# Patient Record
Sex: Female | Born: 1970 | Race: White | Hispanic: No | Marital: Married | Smoking: Never smoker
Health system: Southern US, Community
[De-identification: ages and names within clinical notes are randomized; demographics above are authoritative.]

## PROBLEM LIST (undated history)

## (undated) DIAGNOSIS — N870 Mild cervical dysplasia: Secondary | ICD-10-CM

## (undated) DIAGNOSIS — N943 Premenstrual tension syndrome: Secondary | ICD-10-CM

## (undated) DIAGNOSIS — R896 Abnormal cytological findings in specimens from other organs, systems and tissues: Secondary | ICD-10-CM

## (undated) DIAGNOSIS — IMO0001 Reserved for inherently not codable concepts without codable children: Secondary | ICD-10-CM

## (undated) HISTORY — DX: Premenstrual tension syndrome: N94.3

## (undated) HISTORY — PX: TUBAL LIGATION: SHX77

## (undated) HISTORY — DX: Abnormal cytological findings in specimens from other organs, systems and tissues: R89.6

## (undated) HISTORY — DX: Mild cervical dysplasia: N87.0

## (undated) HISTORY — DX: Reserved for inherently not codable concepts without codable children: IMO0001

---

## 2006-03-09 ENCOUNTER — Other Ambulatory Visit: Admission: RE | Admit: 2006-03-09 | Discharge: 2006-03-09 | Payer: Self-pay | Admitting: Obstetrics and Gynecology

## 2010-01-20 ENCOUNTER — Encounter: Admission: RE | Admit: 2010-01-20 | Discharge: 2010-01-20 | Payer: Self-pay | Admitting: Obstetrics and Gynecology

## 2010-01-27 ENCOUNTER — Encounter: Admission: RE | Admit: 2010-01-27 | Discharge: 2010-01-27 | Payer: Self-pay | Admitting: Obstetrics and Gynecology

## 2010-02-24 DIAGNOSIS — N870 Mild cervical dysplasia: Secondary | ICD-10-CM

## 2010-02-24 HISTORY — PX: COLPOSCOPY: SHX161

## 2010-02-24 HISTORY — DX: Mild cervical dysplasia: N87.0

## 2010-07-07 ENCOUNTER — Other Ambulatory Visit: Payer: Self-pay | Admitting: Obstetrics and Gynecology

## 2010-07-07 DIAGNOSIS — Z09 Encounter for follow-up examination after completed treatment for conditions other than malignant neoplasm: Secondary | ICD-10-CM

## 2010-07-30 ENCOUNTER — Ambulatory Visit
Admission: RE | Admit: 2010-07-30 | Discharge: 2010-07-30 | Disposition: A | Discharge status: Home / Self Care | Payer: BC Managed Care – PPO | Source: Ambulatory Visit | Attending: Obstetrics and Gynecology | Admitting: Obstetrics and Gynecology

## 2010-07-30 DIAGNOSIS — Z09 Encounter for follow-up examination after completed treatment for conditions other than malignant neoplasm: Secondary | ICD-10-CM

## 2011-09-03 DIAGNOSIS — N870 Mild cervical dysplasia: Secondary | ICD-10-CM | POA: Insufficient documentation

## 2011-09-03 DIAGNOSIS — N943 Premenstrual tension syndrome: Secondary | ICD-10-CM | POA: Insufficient documentation

## 2011-09-09 ENCOUNTER — Encounter: Payer: Self-pay | Admitting: Obstetrics and Gynecology

## 2011-09-17 ENCOUNTER — Ambulatory Visit (INDEPENDENT_AMBULATORY_CARE_PROVIDER_SITE_OTHER): Payer: BC Managed Care – PPO | Admitting: Obstetrics and Gynecology

## 2011-09-17 ENCOUNTER — Encounter: Payer: Self-pay | Admitting: Obstetrics and Gynecology

## 2011-09-17 VITALS — BP 112/72 | HR 76 | Resp 16 | Ht 62.0 in | Wt 186.0 lb

## 2011-09-17 DIAGNOSIS — N870 Mild cervical dysplasia: Secondary | ICD-10-CM

## 2011-09-17 NOTE — Progress Notes (Signed)
History of previous cervical treatment:  no treatment  Last pap showed:  ASCUS with NEGATIVE high risk HPV  Date 05/12/2011  High Risk HPV present: no  Colposcopy results:   CIN 1 HPV -   Date:   03/10/2010  Gardasil received: no  Offered:  n/a Tobacco use:  No Using Folic Acid: no  REPEAT PAP VISIT  Subjective:    Marcia Terry is a 41 y.o. female who presents for a  repeat pap for CIN1   Objective:    External genitalia: normal Perianal skin: no external genital warts noted Vagina: normal without discharge Cervix: normal in appearance  Assessment and Plan:   CIN1  Pap # 2/3 performed Return to the office in 4 months for repeat Pap until 3 consecutive normal Pap Smears Continue Folic Acid 1 mg daily  Marcia Terry AMD 5/24/201311:47 AM

## 2011-09-27 ENCOUNTER — Telehealth: Payer: Self-pay

## 2011-09-27 LAB — HUMAN PAPILLOMAVIRUS, HIGH RISK: HPV DNA High Risk: NOT DETECTED

## 2011-09-27 NOTE — Telephone Encounter (Signed)
HPV pending as of 09-27-11  ld

## 2011-09-29 ENCOUNTER — Encounter: Payer: Self-pay | Admitting: Obstetrics and Gynecology

## 2012-01-19 ENCOUNTER — Ambulatory Visit (INDEPENDENT_AMBULATORY_CARE_PROVIDER_SITE_OTHER): Payer: Self-pay | Admitting: Obstetrics and Gynecology

## 2012-01-19 ENCOUNTER — Encounter: Payer: Self-pay | Admitting: Obstetrics and Gynecology

## 2012-01-19 VITALS — BP 106/70 | Wt 155.0 lb

## 2012-01-19 DIAGNOSIS — N870 Mild cervical dysplasia: Secondary | ICD-10-CM

## 2012-01-19 DIAGNOSIS — R87610 Atypical squamous cells of undetermined significance on cytologic smear of cervix (ASC-US): Secondary | ICD-10-CM

## 2012-01-19 NOTE — Progress Notes (Signed)
History of previous cervical treatment:  no treatment  Last pap showed:  atypical squamous cellularity of undetermined significance (ASCUS)  Date 09/17/2011  High Risk HPV present: no  Colposcopy results:   CIN 1 LSIL, MILD DYSPLASIA AND HPV INFECTION  Date:   03/10/2010  Gardasil received: no  Offered:  no Tobacco use:  No Using Folic Acid: yes  REPEAT PAP VISIT  Subjective:    Marcia Terry is a 41 y.o. female who presents for a  repeat pap    Objective:    External genitalia: normal Perianal skin: no external genital warts noted Vagina: normal without discharge Cervix: normal in appearance  Assessment and Plan:   Abnormal pap or biopsy demonstrating CIN 1  Pap # 2/3 performed Return to the office in 4 months for AEX Continue Folic Acid 1 mg daily  Silverio Lay MD 9/25/201311:00 AM

## 2012-01-20 LAB — PAP IG W/ RFLX HPV ASCU

## 2012-01-21 ENCOUNTER — Telehealth: Payer: Self-pay

## 2012-01-21 NOTE — Telephone Encounter (Signed)
Message copied by Larwance Rote on Fri Jan 21, 2012  3:06 PM ------      Message from: Silverio Lay      Created: Thu Jan 20, 2012  8:08 PM       Please call with Pap results. Keep appointment for AEX in 4 months

## 2012-01-21 NOTE — Telephone Encounter (Signed)
LM for pt to keep appt for AEX in 4 months.  To call with questions.  ld

## 2012-04-14 ENCOUNTER — Telehealth: Payer: Self-pay | Admitting: Obstetrics and Gynecology

## 2012-04-14 NOTE — Telephone Encounter (Signed)
Lm on vm for pt to call back.

## 2012-04-18 MED ORDER — FOLIC ACID 0.5 MG HALF TAB: 1.0000 mg | ORAL_TABLET | Freq: Every day | ORAL | Status: DC

## 2012-04-18 NOTE — Telephone Encounter (Signed)
LEFT VM MESSAGE TO LET PT KNOW THAT WE SENT RX FOR FOLIC ACID TO PT PHARMACY.

## 2012-05-10 ENCOUNTER — Encounter: Payer: Self-pay | Admitting: Obstetrics and Gynecology

## 2012-05-10 ENCOUNTER — Ambulatory Visit: Payer: BC Managed Care – PPO | Admitting: Obstetrics and Gynecology

## 2012-05-10 VITALS — BP 100/70 | Ht 62.75 in | Wt 139.0 lb

## 2012-05-10 DIAGNOSIS — N870 Mild cervical dysplasia: Secondary | ICD-10-CM

## 2012-05-10 DIAGNOSIS — Z01419 Encounter for gynecological examination (general) (routine) without abnormal findings: Secondary | ICD-10-CM

## 2012-05-10 DIAGNOSIS — Z124 Encounter for screening for malignant neoplasm of cervix: Secondary | ICD-10-CM

## 2012-05-10 MED ORDER — DROSPIRENONE-ETHINYL ESTRADIOL 3-0.03 MG PO TABS: 1.0000 | ORAL_TABLET | Freq: Every day | ORAL | Status: DC

## 2012-05-10 NOTE — Progress Notes (Signed)
The patient has no complaints    Contraception:Ocella for management of PMS and BTL  Last mammogram: 07/30/2010 Normal Last pap: 05/12/2011 Atypical squamous cells  GC/Chlamydia cultures offered: declined HIV/RPR/HbsAg offered:  declined HSV 1 and 2 glycoprotein offered: declined  Menstrual cycle regular and monthly: Yes every 28 days Menstrual flow normal: Yes lasts 2 days   Urinary symptoms: none Normal bowel movements: Yes Reports abuse at home: No:   Subjective:    Marcia Terry is a 42 y.o. female, G2P2, who presents for an annual exam.     History   Social History  . Marital Status: Unknown    Spouse Name: N/A    Number of Children: N/A  . Years of Education: N/A   Social History Main Topics  . Smoking status: Never Smoker   . Smokeless tobacco: Never Used  . Alcohol Use: No  . Drug Use: No  . Sexually Active: Yes    Birth Control/ Protection: Pill     Comment: Ocella   Other Topics Concern  . None   Social History Narrative  . None    Menstrual cycle:   LMP: No LMP recorded.           Cycle: Normal  The following portions of the patient's history were reviewed and updated as appropriate: allergies, current medications, past family history, past medical history, past social history, past surgical history and problem list.  Review of Systems Pertinent items are noted in HPI. Breast:Negative for breast lump,nipple discharge or nipple retraction Gastrointestinal: Negative for abdominal pain, change in bowel habits or rectal bleeding Urinary:negative   Objective:    BP 100/70  Ht 5' 2.75" (1.594 m)  Wt 139 lb (63.05 kg)  BMI 24.82 kg/m2    Weight:  Wt Readings from Last 1 Encounters:  05/10/12 139 lb (63.05 kg)          BMI: Body mass index is 24.82 kg/(m^2).  General Appearance: Alert, appropriate appearance for age. No acute distress HEENT: Grossly normal Neck / Thyroid: Supple, no masses, nodes or enlargement Lungs: clear to auscultation  bilaterally Back: No CVA tenderness Breast Exam: No dimpling, nipple retraction or discharge. No masses or nodes. and No masses or nodes.No dimpling, nipple retraction or discharge. Cardiovascular: Regular rate and rhythm. S1, S2, no murmur Gastrointestinal: Soft, non-tender, no masses or organomegaly Pelvic Exam: Vulva and vagina appear normal. Bimanual exam reveals normal uterus and adnexa. Rectovaginal: normal rectal, no masses Lymphatic Exam: Non-palpable nodes in neck, clavicular, axillary, or inguinal regions Skin: no rash or abnormalities Neurologic: Normal gait and speech, no tremor  Psychiatric: Alert and oriented, appropriate affect.   Assessment:    Normal gyn exam  Lost 53 lbs in the last year with healthy diet and exercise!   Plan:    mammogram pap smear return annually or prn STD screening: declined Contraception:bilateral tubal ligation MMG with BC Urinary Activity Discussed   Silverio Lay MD

## 2012-05-11 LAB — PAP IG W/ RFLX HPV ASCU

## 2012-05-29 ENCOUNTER — Telehealth: Payer: Self-pay | Admitting: Obstetrics and Gynecology

## 2012-05-29 MED ORDER — DROSPIRENONE-ETHINYL ESTRADIOL 3-0.03 MG PO TABS: 1.0000 | ORAL_TABLET | Freq: Every day | ORAL | Status: DC

## 2012-05-29 NOTE — Telephone Encounter (Signed)
Pt states that Marcia Terry is the [facility] that she has been receiving. States that it was ordered wrong with the pharmacy.  New Rx called into pharmacy.  Darien Ramus, CMA

## 2012-06-13 ENCOUNTER — Other Ambulatory Visit: Payer: Self-pay | Admitting: Obstetrics and Gynecology

## 2012-06-13 DIAGNOSIS — Z1231 Encounter for screening mammogram for malignant neoplasm of breast: Secondary | ICD-10-CM

## 2012-07-05 ENCOUNTER — Ambulatory Visit
Admission: RE | Admit: 2012-07-05 | Discharge: 2012-07-05 | Disposition: A | Discharge status: Home / Self Care | Payer: BC Managed Care – PPO | Source: Ambulatory Visit | Attending: Obstetrics and Gynecology | Admitting: Obstetrics and Gynecology

## 2012-07-05 DIAGNOSIS — Z1231 Encounter for screening mammogram for malignant neoplasm of breast: Secondary | ICD-10-CM

## 2012-07-05 IMAGING — MG MM DIGITAL SCREENING BILAT
4 series · 4 of 4 positions shown · non-contrast
Comparison: Previous exams.

CLINICAL DATA: Screening.

DIGITAL BILATERAL SCREENING MAMMOGRAM WITH CAD

[R CC]
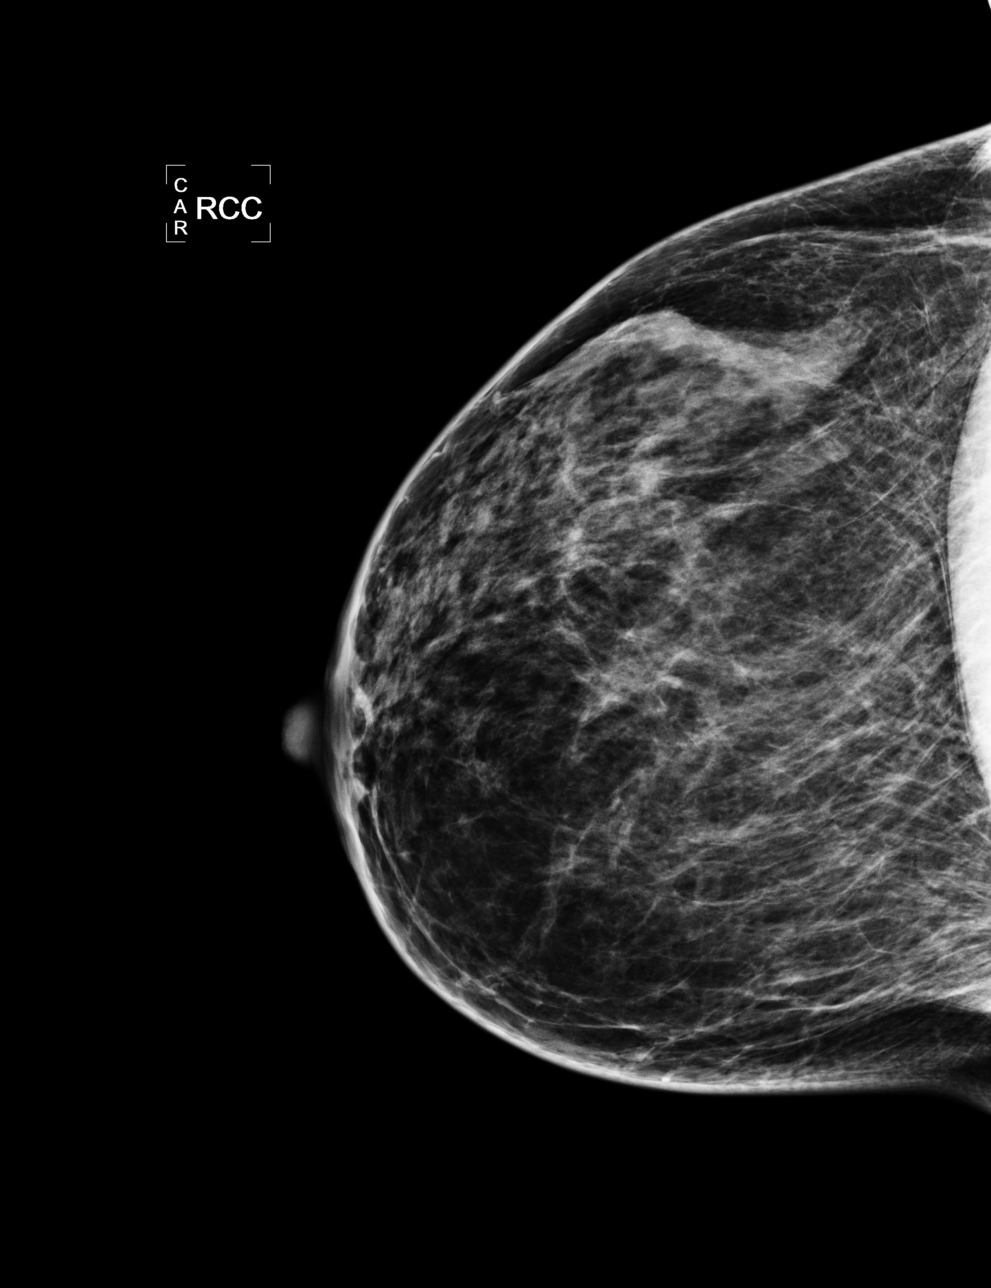

[L CC]
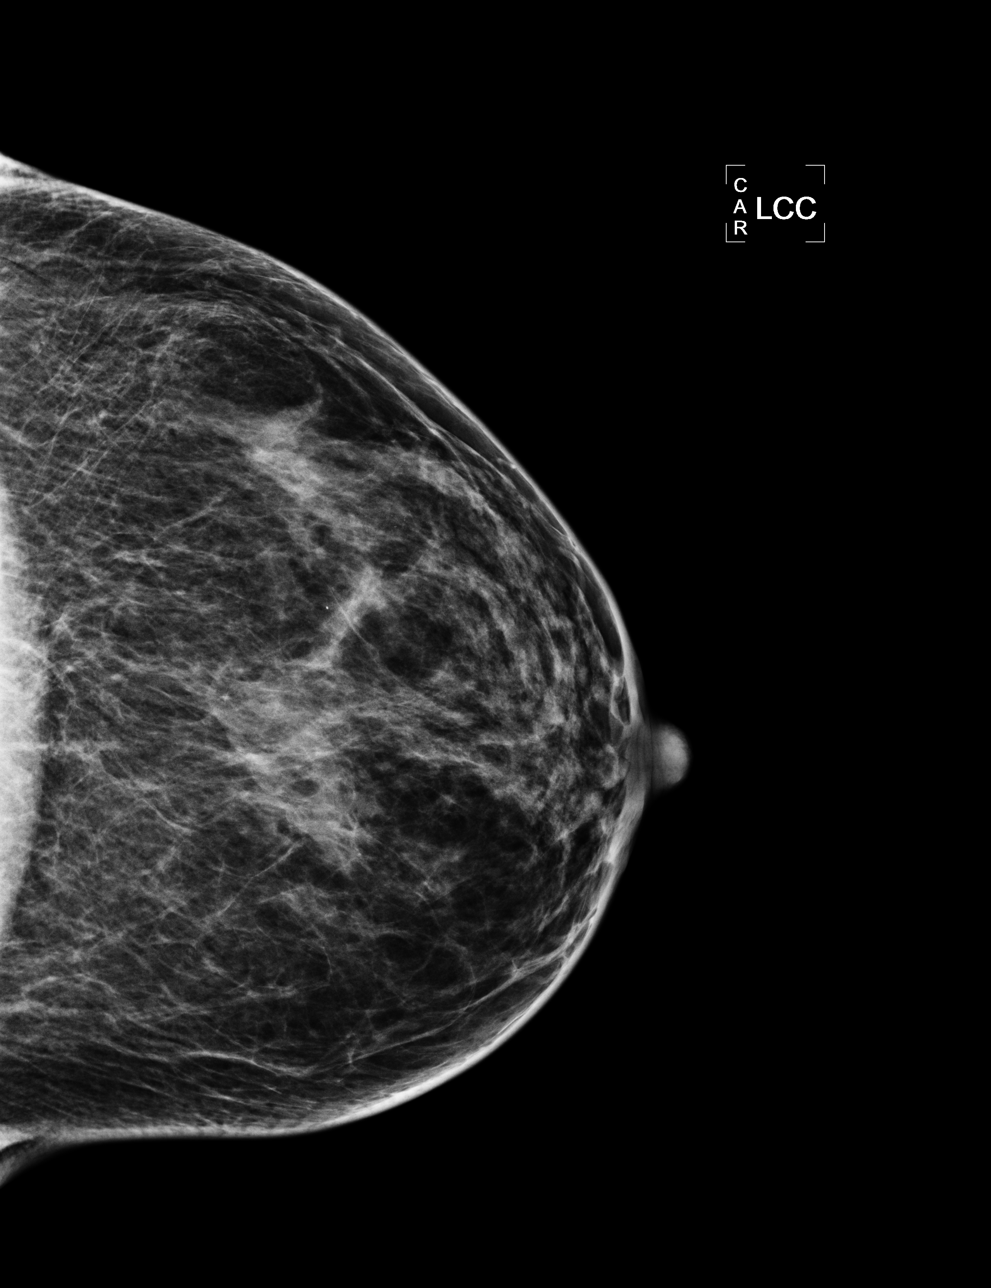

[L MLO]
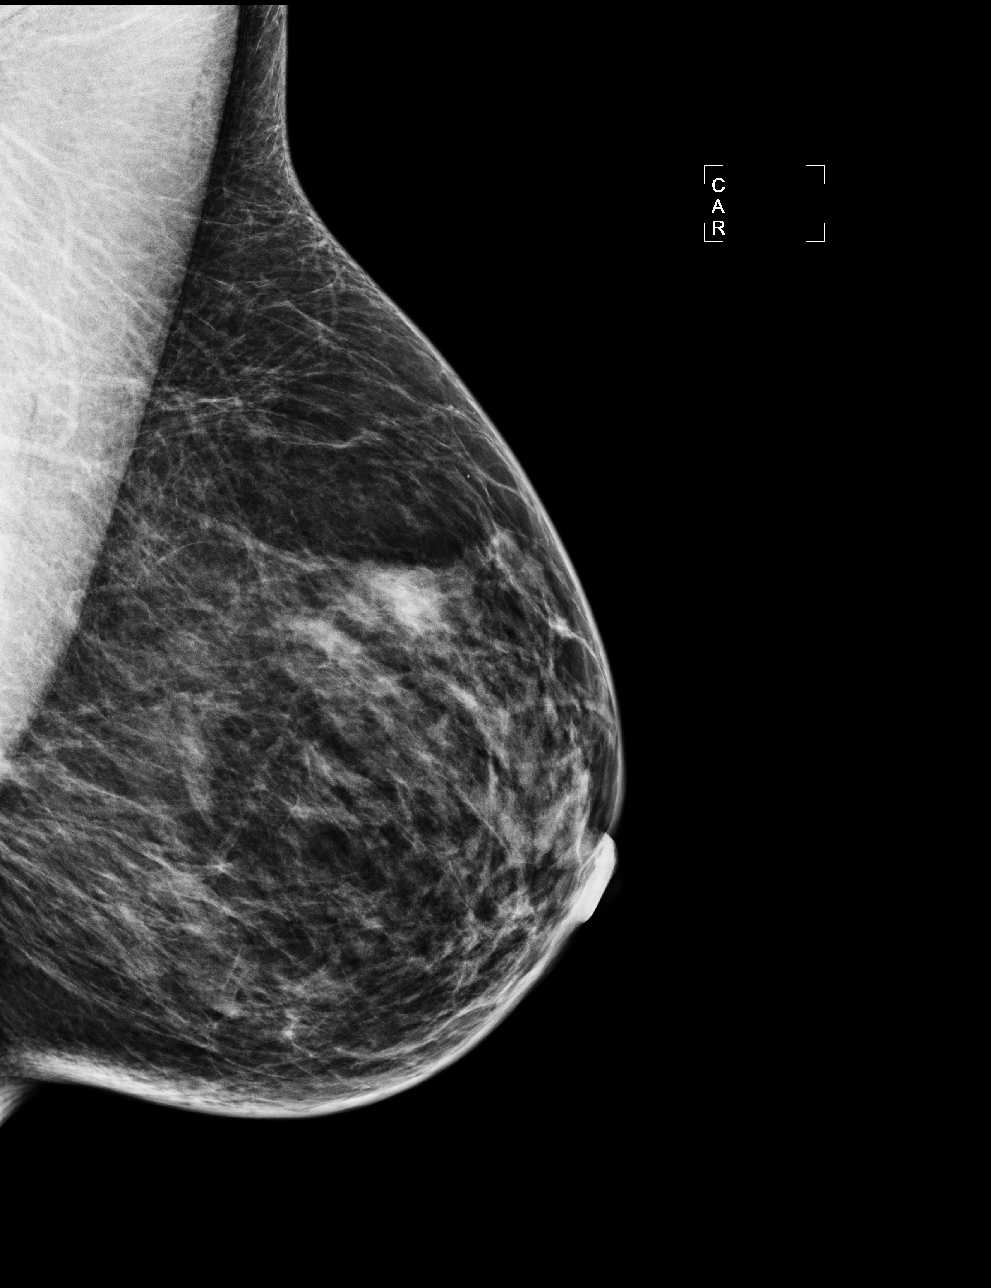

[R MLO]
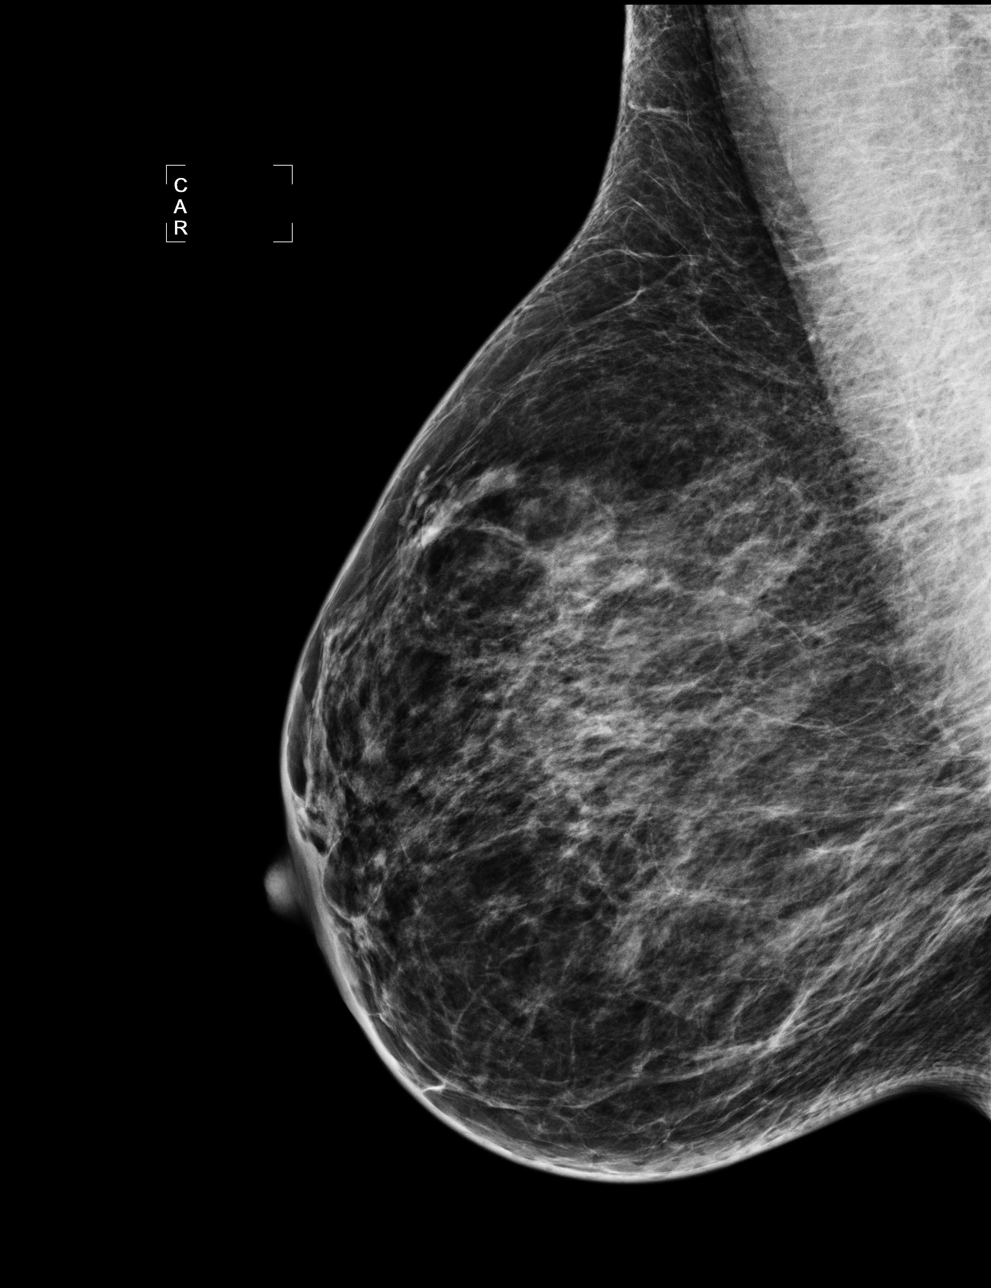

[4 of 4 positions shown; findings below may reference images not displayed]

FINDINGS: ACR Breast Density Category 2: There is a scattered fibroglandular
pattern.

No suspicious masses, architectural distortion, or calcifications
are present.

Images were processed with CAD.
IMPRESSION: No mammographic evidence of malignancy.

A result letter of this screening mammogram will be mailed directly
to the patient.

RECOMMENDATION:
Screening mammogram in one year. (Code:[FI])

BI-RADS CATEGORY 1:  Negative.

## 2014-02-25 ENCOUNTER — Encounter: Payer: Self-pay | Admitting: Obstetrics and Gynecology

## 2014-05-23 ENCOUNTER — Other Ambulatory Visit: Payer: Self-pay | Admitting: Obstetrics and Gynecology

## 2014-05-23 DIAGNOSIS — Z1231 Encounter for screening mammogram for malignant neoplasm of breast: Secondary | ICD-10-CM

## 2014-05-30 ENCOUNTER — Ambulatory Visit
Admission: RE | Admit: 2014-05-30 | Discharge: 2014-05-30 | Disposition: A | Discharge status: Home / Self Care | Payer: BLUE CROSS/BLUE SHIELD | Source: Ambulatory Visit | Attending: Obstetrics and Gynecology | Admitting: Obstetrics and Gynecology

## 2014-05-30 DIAGNOSIS — Z1231 Encounter for screening mammogram for malignant neoplasm of breast: Secondary | ICD-10-CM

## 2014-05-31 ENCOUNTER — Other Ambulatory Visit: Payer: Self-pay | Admitting: Obstetrics and Gynecology

## 2014-05-31 DIAGNOSIS — R928 Other abnormal and inconclusive findings on diagnostic imaging of breast: Secondary | ICD-10-CM

## 2014-06-12 ENCOUNTER — Ambulatory Visit
Admission: RE | Admit: 2014-06-12 | Discharge: 2014-06-12 | Disposition: A | Discharge status: Home / Self Care | Payer: BLUE CROSS/BLUE SHIELD | Source: Ambulatory Visit | Attending: Obstetrics and Gynecology | Admitting: Obstetrics and Gynecology

## 2014-06-12 ENCOUNTER — Other Ambulatory Visit: Payer: Self-pay | Admitting: Obstetrics and Gynecology

## 2014-06-12 DIAGNOSIS — R928 Other abnormal and inconclusive findings on diagnostic imaging of breast: Secondary | ICD-10-CM

## 2014-06-21 ENCOUNTER — Telehealth: Payer: Self-pay | Admitting: Genetic Counselor

## 2014-06-21 NOTE — Telephone Encounter (Signed)
Left mess regarding appt on 07/11/14 at 11 w/Genetic Dx Genetic testing Referred [facility]

## 2014-06-24 ENCOUNTER — Telehealth: Payer: Self-pay | Admitting: Genetic Counselor

## 2014-06-24 NOTE — Telephone Encounter (Signed)
Lt mess regarding appt on 07/11/14 at 11 for Genetic testing

## 2014-06-26 ENCOUNTER — Telehealth: Payer: Self-pay | Admitting: Genetic Counselor

## 2014-06-26 NOTE — Telephone Encounter (Signed)
Pt never confirmed appt, called and informed pt that appt has been cancelled, referring office was contacted in regards to the cancel referral

## 2014-07-11 ENCOUNTER — Other Ambulatory Visit: Payer: BLUE CROSS/BLUE SHIELD

## 2014-07-11 ENCOUNTER — Ambulatory Visit: Payer: BLUE CROSS/BLUE SHIELD

## 2017-05-12 NOTE — Progress Notes (Signed)
Psychiatric Initial Adult Assessment   Patient Identification: Marcia Terry MRN:  [id number] Date of Evaluation:  05/16/2017 Referral Source: Dr. Kirstie PeriAshish Shah Chief Complaint:   Chief Complaint    Depression; Psychiatric Evaluation    "It's hormonal" Visit Diagnosis:    ICD-10-CM   1. MDD (major depressive disorder), recurrent episode, moderate (HCC) F33.1     History of Present Illness:   Marcia Terry is a 47 y.o. year old female with a history of depression, who is referred for depression.  Per chart from Dr. Beatrix FettersAshish, patient is on sertraline.    Patient started up crying; stating that it is "hormonal." She repeatedly states that she is "not like this (crying)" and "in control." She states that her grandmother and her paternal and deceased from Huntington disease.  She states that her father, 49 year old was recently diagnosed with Huntington disease.  She states that it (Huntington's disease) has been always back of her mind and her family would think "who will start to have symptoms." Although she is hoping to get genetic test, she wants to see somebody first (mental health specialist) so that she can handle things better. She notices that she "stopped caring about everything" over the past several months. She reports as an example that her room does not appear as she has a degree in Social research officer, governmentinterior design. She also reports "net empty house"; talks about her son, who got divorce and her daughter, whose husband is laid off from work. 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 She had spoken with her husband about Huntington's disease; she reports that he takes it well and provides her good support. She takes sertraline 25 mg daily for six weeks; although she feels "better" since then, it makes her some fogginess.   Patient endorses middle insomnia, stating that she takes care of her 49-year-old grand son.  She feels fatigued and has anhedonia. She has fair appetite.   She has difficulty with concentration.  She denies SI.  She feels anxious and tense and has occasional panic attacks.  She occasionally drinks alcohol.  She denies drug use.   Associated Signs/Symptoms: Depression Symptoms:  depressed mood, anhedonia, insomnia, fatigue, (Hypo) Manic Symptoms:  denies decreased for sleep or euphoria Anxiety Symptoms:  Excessive Worry, Panic Symptoms, Psychotic Symptoms:  denies paranoia,AH, VH PTSD Symptoms: NA  Past Psychiatric History:  Outpatient: denies (diagnosed with depression when she had a son) Psychiatry admission: denies  Previous suicide attempt: denies  Past trials of medication: sertraline, fluoxetine ("could not function") History of violence:   Previous Psychotropic Medications: Yes   Substance Abuse History in the last 12 months:  No.  Consequences of Substance Abuse: NA  Past Medical History:  Past Medical History:  Diagnosis Date  . ASCUS (atypical squamous cells of undetermined significance) on Pap smear   . CIN I (cervical intraepithelial neoplasia I) 11/11   colpo done  . PMS (premenstrual syndrome)     Past Surgical History:  Procedure Laterality Date  . COLPOSCOPY  11/11  . TUBAL LIGATION      Family Psychiatric History:  Denies    Family History:  Family History  Problem Relation Age of Onset  . Cancer Maternal Grandmother 80       colon  . Hypertension Mother   . Huntington's disease Paternal Aunt   . Huntington's disease Paternal Grandmother     Social History:   Social History   Socioeconomic History  . Marital status: Married  Spouse name: None  . Number of children: None  . Years of education: None  . Highest education level: None  Social Needs  . Financial resource strain: None  . Food insecurity - worry: None  . Food insecurity - inability: None  . Transportation needs - medical: None  . Transportation needs - non-medical: None  Occupational History  . None  Tobacco Use  .  Smoking status: Never Smoker  . Smokeless tobacco: Never Used  Substance and Sexual Activity  . Alcohol use: No  . Drug use: No  . Sexual activity: Yes    Birth control/protection: Pill    Comment: Ocella  Other Topics Concern  . None  Social History Narrative  . None    Additional Social History:  Married for 29 years, has two children (23, 24), one in [employer] Education: graduated from college Work: unemployed  Allergies:   Allergies  Allergen Reactions  . Penicillins     Metabolic Disorder Labs: No results found for: HGBA1C, MPG No results found for: PROLACTIN No results found for: CHOL, TRIG, HDL, CHOLHDL, VLDL, LDLCALC   Current Medications: Current Outpatient Medications  Medication Sig Dispense Refill  . Cholecalciferol (VITAMIN D PO) Take by mouth.    . drospirenone-ethinyl estradiol (YASMIN,ZARAH,SYEDA) 3-0.03 MG tablet Take 1 tablet by mouth daily. 1 Package 11  . fish oil-omega-3 fatty acids 1000 MG capsule Take 2 g by mouth daily.    . folic acid (FOLVITE) 0.5 MG tablet Take 1 tablet (1 mg total) by mouth daily. 30 tablet 11  . venlafaxine XR (EFFEXOR-XR) 37.5 MG 24 hr capsule Start 37.5 mg daily for one week, then 75 mg daily 60 capsule 0   No current facility-administered medications for this visit.     Neurologic: Headache: No Seizure: No Paresthesias:No  Musculoskeletal: Strength & Muscle Tone: within normal limits Gait & Station: normal Patient leans: N/A  Psychiatric Specialty Exam: Review of Systems  Psychiatric/Behavioral: Positive for depression. Negative for hallucinations, memory loss, substance abuse and suicidal ideas. The patient is nervous/anxious and has insomnia.   All other systems reviewed and are negative.   Blood pressure 112/60, pulse 65, height 5\' 2"  (1.575 m), weight 137 lb (62.1 kg), SpO2 100 %.Body mass index is 25.06 kg/m.  General Appearance: Fairly Groomed  Eye Contact:  Good  Speech:  Clear and Coherent, repeatedly  interrupts interview , although redirectable  Volume:  Normal  Mood:  Anxious and Depressed  Affect:  Appropriate, Congruent, Restricted and Tearful  Thought Process:  Coherent and Goal Directed  Orientation:  Full (Time, Place, and Person)  Thought Content:  Logical  Suicidal Thoughts:  No  Homicidal Thoughts:  No  Memory:  Immediate;   Good Recent;   Good Remote;   Good  Judgement:  Good  Insight:  Fair  Psychomotor Activity:  Normal  Concentration:  Concentration: Good and Attention Span: Good  Recall:  Good  Fund of Knowledge:Good  Language: Good  Akathisia:  No  Handed:  Right  AIMS (if indicated):  N/A  Assets:  Communication Skills Desire for Improvement  ADL's:  Intact  Cognition: WNL  Sleep:  poor   Assessment Marcia Terry is a 47 y.o. year old female with a history of depression, who is referred for depression.  Per chart from Dr. Beatrix Fetters, patient is on sertraline.    # MDD, moderate, recurrent without psychotic features Exam is notable for tearfulness and the patient endorses neurovegetative symptoms and anxiety in the setting of  her father being diagnosed with Huntington disease and concerned about her children.  We will switch from sertraline to venlafaxine to target depression and anxiety given patient complains of some fogginess from sertraline.  Will start from lower dose given patient reports sensitivity to medication.  Validated her concerns about Huntington disease.  Discussed self compassion and cognitive diffusion.  She will greatly benefit from supportive therapy/CBT; referral is made.   Plan 1. Discontinue sertraline  2. Stat venlafaxine 37.5 mg daily for one week, then 75 mg daily  3. Referral to therapy 4. Return to clinic in one month for 30 mins - pending TSH at Dr. Margaretmary Eddy office - Will ask more about her developmental history  The patient demonstrates the following risk factors for suicide: Chronic risk factors for suicide include: psychiatric  disorder of depression. Acute risk factors for suicide include: N/A. Protective factors for this patient include: positive social support, responsibility to others (children, family), coping skills and hope for the future. Considering these factors, the overall suicide risk at this point appears to be low. Patient is appropriate for outpatient follow up.   Treatment Plan Summary: Plan as above   [name], MD 1/21/20193:47 PM

## 2017-05-16 ENCOUNTER — Encounter (HOSPITAL_COMMUNITY): Payer: Self-pay | Admitting: Psychiatry

## 2017-05-16 ENCOUNTER — Encounter (INDEPENDENT_AMBULATORY_CARE_PROVIDER_SITE_OTHER): Payer: Self-pay

## 2017-05-16 ENCOUNTER — Ambulatory Visit (INDEPENDENT_AMBULATORY_CARE_PROVIDER_SITE_OTHER): Payer: BLUE CROSS/BLUE SHIELD | Admitting: Psychiatry

## 2017-05-16 VITALS — BP 112/60 | HR 65 | Ht 62.0 in | Wt 137.0 lb

## 2017-05-16 DIAGNOSIS — R450 Nervousness: Secondary | ICD-10-CM

## 2017-05-16 DIAGNOSIS — G4700 Insomnia, unspecified: Secondary | ICD-10-CM | POA: Diagnosis not present

## 2017-05-16 DIAGNOSIS — F419 Anxiety disorder, unspecified: Secondary | ICD-10-CM | POA: Diagnosis not present

## 2017-05-16 DIAGNOSIS — F410 Panic disorder [episodic paroxysmal anxiety] without agoraphobia: Secondary | ICD-10-CM | POA: Diagnosis not present

## 2017-05-16 DIAGNOSIS — F331 Major depressive disorder, recurrent, moderate: Secondary | ICD-10-CM | POA: Diagnosis not present

## 2017-05-16 MED ORDER — VENLAFAXINE HCL ER 37.5 MG PO CP24: ORAL_CAPSULE | ORAL | 0 refills | Status: DC

## 2017-05-16 NOTE — Patient Instructions (Addendum)
1. Discontinue sertraline  2. Stat venlafaxine 37.5 mg daily for one week, then 75 mg daily  3. Referral to therapy 4. Return to clinic in one month for 30 mins

## 2017-06-10 ENCOUNTER — Ambulatory Visit (HOSPITAL_COMMUNITY): Payer: Self-pay | Admitting: Psychiatry

## 2017-06-14 NOTE — Progress Notes (Signed)
BH MD/PA/NP OP Progress Note  06/16/2017 3:31 PM Marcia Terry  MRN:  [id number]  Chief Complaint:  Chief Complaint    Depression; Anxiety; Follow-up     HPI:  Patient presents for follow-up appointment for depression.  She states that she feels better after starting Effexor.  She talks with her mother and received update about her father; he seems to be responding well to medication.  Her mother commented that the patient seems to be doing better. he feels that it is true and feels "lifted." It has been getting easier for her to deal with things. She enjoys being with her 62-year-old grandchildren.  She notices that she has mild diaphoresis and more insomnia after starting Effexor; she prefers to stay on current dose as she finds it beneficial for her mood.  She has good appetite.  She has better concentration.  She has more energy and motivation.  She denies SI.  She occasionally feels anxious and tense.  She feels less irritable.  She denies panic attacks.   Visit Diagnosis:    ICD-10-CM   1. MDD (major depressive disorder), recurrent episode, moderate (HCC) F33.1     Past Psychiatric History:  I have reviewed the patient's psychiatry history in detail and updated the patient record. Outpatient: denies (diagnosed with depression when she had a son) Psychiatry admission: denies  Previous suicide attempt: denies  Past trials of medication: sertraline, fluoxetine ("could not function") History of violence:     Past Medical History:  Past Medical History:  Diagnosis Date  . ASCUS (atypical squamous cells of undetermined significance) on Pap smear   . CIN I (cervical intraepithelial neoplasia I) 11/11   colpo done  . PMS (premenstrual syndrome)     Past Surgical History:  Procedure Laterality Date  . COLPOSCOPY  11/11  . TUBAL LIGATION      Family Psychiatric History: I have reviewed the patient's family history in detail and updated the patient record.  Family History:   Family History  Problem Relation Age of Onset  . Hypertension Mother   . Huntington's disease Father   . Cancer Maternal Grandmother 80       colon  . Huntington's disease Paternal Aunt   . Huntington's disease Paternal Grandmother     Social History:  Social History   Socioeconomic History  . Marital status: Married    Spouse name: None  . Number of children: None  . Years of education: None  . Highest education level: None  Social Needs  . Financial resource strain: None  . Food insecurity - worry: None  . Food insecurity - inability: None  . Transportation needs - medical: None  . Transportation needs - non-medical: None  Occupational History  . None  Tobacco Use  . Smoking status: Never Smoker  . Smokeless tobacco: Never Used  Substance and Sexual Activity  . Alcohol use: No  . Drug use: No  . Sexual activity: Yes    Birth control/protection: Pill    Comment: Ocella  Other Topics Concern  . None  Social History Narrative  . None   Married for 29 years, has two children (23, 34), one in [employer] Education: graduated from college Work: unemployed  Allergies:  Allergies  Allergen Reactions  . Penicillins     Metabolic Disorder Labs: No results found for: HGBA1C, MPG No results found for: PROLACTIN No results found for: CHOL, TRIG, HDL, CHOLHDL, VLDL, LDLCALC No results found for: TSH  Therapeutic Level Labs: No results  found for: LITHIUM No results found for: VALPROATE No components found for:  CBMZ  Current Medications: Current Outpatient Medications  Medication Sig Dispense Refill  . Cholecalciferol (VITAMIN D PO) Take by mouth.    . drospirenone-ethinyl estradiol (YASMIN,ZARAH,SYEDA) 3-0.03 MG tablet Take 1 tablet by mouth daily. 1 Package 11  . fish oil-omega-3 fatty acids 1000 MG capsule Take 2 g by mouth daily.    . folic acid (FOLVITE) 0.5 MG tablet Take 1 tablet (1 mg total) by mouth daily. 30 tablet 11  . venlafaxine XR (EFFEXOR-XR) 75 MG  24 hr capsule Take 1 capsule (75 mg total) by mouth daily. 30 capsule 0  . traZODone (DESYREL) 50 MG tablet 25-50 mg at night as needed for sleep 30 tablet 0   No current facility-administered medications for this visit.      Musculoskeletal: Strength & Muscle Tone: within normal limits Gait & Station: normal Patient leans: N/A  Psychiatric Specialty Exam: Review of Systems  Psychiatric/Behavioral: Positive for depression. Negative for hallucinations, substance abuse and suicidal ideas. The patient is nervous/anxious and has insomnia.   All other systems reviewed and are negative.   Blood pressure 134/90, pulse 90, height 5\' 2"  (1.575 m), weight 136 lb (61.7 kg), SpO2 100 %.Body mass index is 24.87 kg/m.  General Appearance: Fairly Groomed  Eye Contact:  Good  Speech:  Clear and Coherent  Volume:  Normal  Mood:  "better"  Affect:  Appropriate, Congruent and reactive  Thought Process:  Coherent and Goal Directed  Orientation:  Full (Time, Place, and Person)  Thought Content: Logical   Suicidal Thoughts:  No  Homicidal Thoughts:  No  Memory:  Immediate;   Good Recent;   Good Remote;   Good  Judgement:  Good  Insight:  Fair  Psychomotor Activity:  Normal  Concentration:  Concentration: Good and Attention Span: Good  Recall:  Good  Fund of Knowledge: Good  Language: Good  Akathisia:  No  Handed:  Right  AIMS (if indicated): not done  Assets:  Communication Skills Desire for Improvement  ADL's:  Intact  Cognition: WNL  Sleep:  Poor   Screenings:   Assessment and Plan:  Marcia Terry is a 47 y.o. year old female with a history of depression, who presents for follow up appointment for MDD (major depressive disorder), recurrent episode, moderate (HCC)  # MDD, moderate, recurrent without psychotic features There has been significant improvement in neurovegetative symptoms and anxiety since starting Effexor.  Psychosocial stressors including her father being diagnosed  with Huntington disease.  Will continue Effexor to target depression and anxiety.  Noted that patient reports worsening insomnia since starting on this medication, although she prefers to stay on Effexor.  Will continue to monitor. Discussed cognitive diffusion.  She will greatly benefit from supportive therapy/CBT; she has an appointment with Ms. Bynum for therapy.   # Insomnia  Discussed sleep hygiene.  Discussed behavioral activation. She may try melatonin for insomia.  Start trazodone as needed for insomnia.     Plan 1.Continue Venlafaxine 75 mg daily  2. Start Trazodone 25 mg-50 mg at night as needed for sleep 3. Return to clinic in one month for 30 mins 4. She is scheduled to see Ms. Bynum for therapy - pending TSH at Dr. Margaretmary EddyShah's office - Will ask more about her developmental history  The patient demonstrates the following risk factors for suicide: Chronic risk factors for suicide include: psychiatric disorder of depression. Acute risk factors for suicide include: N/A. Protective  factors for this patient include: positive social support, responsibility to others (children, family), coping skills and hope for the future. Considering these factors, the overall suicide risk at this point appears to be low. Patient is appropriate for outpatient follow up.  The duration of this appointment visit was 30 minutes of face-to-face time with the patient.  Greater than 50% of this time was spent in counseling, explanation of  diagnosis, planning of further management, and coordination of care.  [name], MD 06/16/2017, 3:31 PM

## 2017-06-16 ENCOUNTER — Encounter (HOSPITAL_COMMUNITY): Payer: Self-pay | Admitting: Psychiatry

## 2017-06-16 ENCOUNTER — Ambulatory Visit (INDEPENDENT_AMBULATORY_CARE_PROVIDER_SITE_OTHER): Payer: BLUE CROSS/BLUE SHIELD | Admitting: Psychiatry

## 2017-06-16 VITALS — BP 134/90 | HR 90 | Ht 62.0 in | Wt 136.0 lb

## 2017-06-16 DIAGNOSIS — G4700 Insomnia, unspecified: Secondary | ICD-10-CM

## 2017-06-16 DIAGNOSIS — F331 Major depressive disorder, recurrent, moderate: Secondary | ICD-10-CM | POA: Diagnosis not present

## 2017-06-16 DIAGNOSIS — R450 Nervousness: Secondary | ICD-10-CM | POA: Diagnosis not present

## 2017-06-16 DIAGNOSIS — F419 Anxiety disorder, unspecified: Secondary | ICD-10-CM

## 2017-06-16 DIAGNOSIS — R61 Generalized hyperhidrosis: Secondary | ICD-10-CM

## 2017-06-16 MED ORDER — TRAZODONE HCL 50 MG PO TABS: ORAL_TABLET | ORAL | 0 refills | Status: DC

## 2017-06-16 MED ORDER — VENLAFAXINE HCL ER 75 MG PO CP24: 75.0000 mg | ORAL_CAPSULE | Freq: Every day | ORAL | 0 refills | Status: DC

## 2017-06-16 NOTE — Patient Instructions (Addendum)
1.Continue Venlafaxine 75 mg daily  2. Try malatonin 3 mg at night two hours before going to bed 2. Start Trazodone 25 mg-50 mg at night as needed for sleep 3. Return to clinic in one month for 30 mins

## 2017-06-30 ENCOUNTER — Ambulatory Visit (HOSPITAL_COMMUNITY): Payer: Self-pay | Admitting: Psychiatry

## 2017-07-11 ENCOUNTER — Other Ambulatory Visit (HOSPITAL_COMMUNITY): Payer: Self-pay | Admitting: Psychiatry

## 2017-07-11 MED ORDER — VENLAFAXINE HCL ER 75 MG PO CP24: 75.0000 mg | ORAL_CAPSULE | Freq: Every day | ORAL | 0 refills | Status: DC

## 2017-07-11 MED ORDER — TRAZODONE HCL 50 MG PO TABS: ORAL_TABLET | ORAL | 0 refills | Status: DC

## 2017-07-19 NOTE — Progress Notes (Signed)
BH MD/PA/NP OP Progress Note  07/21/2017 3:01 PM Marcia Terry  MRN:  [id number]  Chief Complaint:  Chief Complaint    Anxiety; Depression; Follow-up     HPI:  Patient presents for follow-up appointment for depression.  She reports frustration of not having been able to see her therapist due to a couple of reschedule.  She would like to find a therapist in another office.  She states that she would like to talk things so that she will be "mentally ready" whether or not to take gene test for Huntington's disease. She wants to hope for the best and prepare for the worst. She states that there will be a new medication to stop progression of huntington's disease, and she feels hopeful about it. Although she feels occasionally down and anxious, she believes that it has become easier to manage it. She finds trazodone to be helpful for insomnia. She denies crying spells as she used to. She has fair concentration. She denies panic attacks. She denies    Visit Diagnosis:    ICD-10-CM   1. MDD (major depressive disorder), recurrent episode, moderate (HCC) F33.1     Past Psychiatric History:  I have reviewed the patient's psychiatry history in detail and updated the patient record. Outpatient:denies (diagnosed with depression when she had a son) Psychiatry admission:denies Previous suicide attempt:denies Past trials of medication:sertraline, fluoxetine ("could not function") History of violence:   Past Medical History:  Past Medical History:  Diagnosis Date  . ASCUS (atypical squamous cells of undetermined significance) on Pap smear   . CIN I (cervical intraepithelial neoplasia I) 11/11   colpo done  . PMS (premenstrual syndrome)     Past Surgical History:  Procedure Laterality Date  . COLPOSCOPY  11/11  . TUBAL LIGATION      Family Psychiatric History: I have reviewed the patient's family history in detail and updated the patient record.  Family History:  Family History   Problem Relation Age of Onset  . Hypertension Mother   . Huntington's disease Father   . Cancer Maternal Grandmother 80       colon  . Huntington's disease Paternal Aunt   . Huntington's disease Paternal Grandmother     Social History:  Social History   Socioeconomic History  . Marital status: Married    Spouse name: Not on file  . Number of children: Not on file  . Years of education: Not on file  . Highest education level: Not on file  Occupational History  . Not on file  Social Needs  . Financial resource strain: Not on file  . Food insecurity:    Worry: Not on file    Inability: Not on file  . Transportation needs:    Medical: Not on file    Non-medical: Not on file  Tobacco Use  . Smoking status: Never Smoker  . Smokeless tobacco: Never Used  Substance and Sexual Activity  . Alcohol use: No  . Drug use: No  . Sexual activity: Yes    Birth control/protection: Pill    Comment: Ocella  Lifestyle  . Physical activity:    Days per week: Not on file    Minutes per session: Not on file  . Stress: Not on file  Relationships  . Social connections:    Talks on phone: Not on file    Gets together: Not on file    Attends religious service: Not on file    Active member of club or organization: Not on  file    Attends meetings of clubs or organizations: Not on file    Relationship status: Not on file  Other Topics Concern  . Not on file  Social History Narrative  . Not on file    Allergies:  Allergies  Allergen Reactions  . Penicillins     Metabolic Disorder Labs: No results found for: HGBA1C, MPG No results found for: PROLACTIN No results found for: CHOL, TRIG, HDL, CHOLHDL, VLDL, LDLCALC No results found for: TSH  Therapeutic Level Labs: No results found for: LITHIUM No results found for: VALPROATE No components found for:  CBMZ  Current Medications: Current Outpatient Medications  Medication Sig Dispense Refill  . Cholecalciferol (VITAMIN D PO)  Take by mouth.    . drospirenone-ethinyl estradiol (YASMIN,ZARAH,SYEDA) 3-0.03 MG tablet Take 1 tablet by mouth daily. 1 Package 11  . fish oil-omega-3 fatty acids 1000 MG capsule Take 2 g by mouth daily.    . folic acid (FOLVITE) 0.5 MG tablet Take 1 tablet (1 mg total) by mouth daily. 30 tablet 11  . traZODone (DESYREL) 50 MG tablet 25-50 mg at night as needed for sleep 90 tablet 0  . venlafaxine XR (EFFEXOR-XR) 75 MG 24 hr capsule Take 1 capsule (75 mg total) by mouth daily. 90 capsule 0   No current facility-administered medications for this visit.      Musculoskeletal: Strength & Muscle Tone: within normal limits Gait & Station: normal Patient leans: N/A  Psychiatric Specialty Exam: Review of Systems  Psychiatric/Behavioral: Positive for depression. Negative for hallucinations, memory loss, substance abuse and suicidal ideas. The patient is nervous/anxious and has insomnia.   All other systems reviewed and are negative.   Blood pressure 111/77, pulse 75, height 5\' 2"  (1.575 m), weight 140 lb (63.5 kg), SpO2 99 %.Body mass index is 25.61 kg/m.  General Appearance: Fairly Groomed  Eye Contact:  Good  Speech:  Clear and Coherent  Volume:  Normal  Mood:  "better"  Affect:  Appropriate and Congruent slightly tense and guarded at times  Thought Process:  Coherent and Goal Directed  Orientation:  Full (Time, Place, and Person)  Thought Content: Logical   Suicidal Thoughts:  No  Homicidal Thoughts:  No  Memory:  Immediate;   Good Recent;   Good Remote;   Good  Judgement:  Good  Insight:  Fair  Psychomotor Activity:  Normal  Concentration:  Concentration: Good and Attention Span: Good  Recall:  Good  Fund of Knowledge: Good  Language: Good  Akathisia:  No  Handed:  Right  AIMS (if indicated): not done  Assets:  Communication Skills Desire for Improvement  ADL's:  Intact  Cognition: WNL  Sleep:  Fair   Screenings:   Assessment and Plan:  Marcia Terry is a 47 y.o.  year old female with a history of depression, who presents for follow up appointment for MDD (major depressive disorder), recurrent episode, moderate (HCC)  # MDD, moderate, recurrent without psychotic features There has been significant improvement in neurovegetative symptoms and anxiety since starting Effexor. Psychosocial Stressors including her father being diagnosed with Huntington disease.  Will continue Effexor to target depression and anxiety.  Will continue trazodone as needed for insomnia.  Discussed behavioral activation.  She will make a therapy appointment at [facility] office.   Plan  I have reviewed and updated plans as below 1 .Continue Venlafaxine 75 mg daily  2. Continue Trazodone 25 mg-50 mg at night as needed for sleep 3. Return to clinic in two  months for 30 mins 4. Check with your provider if you have checked for thyroid (TSH) 5. [facility] office for therapy Address: [address] [place], [place], [place] [address]  [phone] - Will ask more about her developmental history  The patient demonstrates the following risk factors for suicide: Chronic risk factors for suicide include:psychiatric disorder ofdepression. Acute risk factorsfor suicide include: N/A. Protective factorsfor this patient include: positive social support, responsibility to others (children, family), coping skills and hope for the future. Considering these factors, the overall suicide risk at this point appears to below. Patientisappropriate for outpatient follow up.  The duration of this appointment visit was 30 minutes of face-to-face time with the patient.  Greater than 50% of this time was spent in counseling, explanation of  diagnosis, planning of further management, and coordination of care. [name], MD 07/21/2017, 3:01 PM

## 2017-07-21 ENCOUNTER — Ambulatory Visit (INDEPENDENT_AMBULATORY_CARE_PROVIDER_SITE_OTHER): Payer: BLUE CROSS/BLUE SHIELD | Admitting: Psychiatry

## 2017-07-21 ENCOUNTER — Encounter (HOSPITAL_COMMUNITY): Payer: Self-pay | Admitting: Psychiatry

## 2017-07-21 VITALS — BP 111/77 | HR 75 | Ht 62.0 in | Wt 140.0 lb

## 2017-07-21 DIAGNOSIS — F331 Major depressive disorder, recurrent, moderate: Secondary | ICD-10-CM | POA: Diagnosis not present

## 2017-07-21 DIAGNOSIS — Z79899 Other long term (current) drug therapy: Secondary | ICD-10-CM

## 2017-07-21 MED ORDER — VENLAFAXINE HCL ER 75 MG PO CP24: 75.0000 mg | ORAL_CAPSULE | Freq: Every day | ORAL | 0 refills | Status: DC

## 2017-07-21 NOTE — Patient Instructions (Addendum)
1 .Continue Venlafaxine 75 mg daily  2. Continue Trazodone 25 mg-50 mg at night as needed for sleep 3. Return to clinic in two months for 30 mins 4. Check with your provider if you have checked for thyroid (TSH) 5. [facility] Address: [address], [address], [place], [place] [address]  [phone]

## 2017-07-27 ENCOUNTER — Ambulatory Visit (HOSPITAL_COMMUNITY): Payer: Self-pay | Admitting: Psychiatry

## 2017-09-15 NOTE — Progress Notes (Deleted)
BH MD/PA/NP OP Progress Note  09/15/2017 9:36 AM Marcia Terry  MRN:  [id number]  Chief Complaint:  HPI: *** Visit Diagnosis: No diagnosis found.  Past Psychiatric History:  Please see initial evaluation for full details. I have reviewed the history. No updates at this time.    Past Medical History:  Past Medical History:  Diagnosis Date  . ASCUS (atypical squamous cells of undetermined significance) on Pap smear   . CIN I (cervical intraepithelial neoplasia I) 11/11   colpo done  . PMS (premenstrual syndrome)     Past Surgical History:  Procedure Laterality Date  . COLPOSCOPY  11/11  . TUBAL LIGATION      Family Psychiatric History: Please see initial evaluation for full details. I have reviewed the history. No updates at this time.     Family History:  Family History  Problem Relation Age of Onset  . Hypertension Mother   . Huntington's disease Father   . Cancer Maternal Grandmother 80       colon  . Huntington's disease Paternal Aunt   . Huntington's disease Paternal Grandmother     Social History:  Social History   Socioeconomic History  . Marital status: Married    Spouse name: Not on file  . Number of children: Not on file  . Years of education: Not on file  . Highest education level: Not on file  Occupational History  . Not on file  Social Needs  . Financial resource strain: Not on file  . Food insecurity:    Worry: Not on file    Inability: Not on file  . Transportation needs:    Medical: Not on file    Non-medical: Not on file  Tobacco Use  . Smoking status: Never Smoker  . Smokeless tobacco: Never Used  Substance and Sexual Activity  . Alcohol use: No  . Drug use: No  . Sexual activity: Yes    Birth control/protection: Pill    Comment: Ocella  Lifestyle  . Physical activity:    Days per week: Not on file    Minutes per session: Not on file  . Stress: Not on file  Relationships  . Social connections:    Talks on phone: Not on file     Gets together: Not on file    Attends religious service: Not on file    Active member of club or organization: Not on file    Attends meetings of clubs or organizations: Not on file    Relationship status: Not on file  Other Topics Concern  . Not on file  Social History Narrative  . Not on file    Allergies:  Allergies  Allergen Reactions  . Penicillins     Metabolic Disorder Labs: No results found for: HGBA1C, MPG No results found for: PROLACTIN No results found for: CHOL, TRIG, HDL, CHOLHDL, VLDL, LDLCALC No results found for: TSH  Therapeutic Level Labs: No results found for: LITHIUM No results found for: VALPROATE No components found for:  CBMZ  Current Medications: Current Outpatient Medications  Medication Sig Dispense Refill  . Cholecalciferol (VITAMIN D PO) Take by mouth.    . drospirenone-ethinyl estradiol (YASMIN,ZARAH,SYEDA) 3-0.03 MG tablet Take 1 tablet by mouth daily. 1 Package 11  . fish oil-omega-3 fatty acids 1000 MG capsule Take 2 g by mouth daily.    . folic acid (FOLVITE) 0.5 MG tablet Take 1 tablet (1 mg total) by mouth daily. 30 tablet 11  . traZODone (DESYREL) 50 MG  tablet 25-50 mg at night as needed for sleep 90 tablet 0  . venlafaxine XR (EFFEXOR-XR) 75 MG 24 hr capsule Take 1 capsule (75 mg total) by mouth daily. 90 capsule 0   No current facility-administered medications for this visit.      Musculoskeletal: Strength & Muscle Tone: within normal limits Gait & Station: normal Patient leans: N/A  Psychiatric Specialty Exam: ROS  There were no vitals taken for this visit.There is no height or weight on file to calculate BMI.  General Appearance: Fairly Groomed  Eye Contact:  Good  Speech:  Clear and Coherent  Volume:  Normal  Mood:  {BHH MOOD:22306}  Affect:  {Affect (PAA):22687}  Thought Process:  Coherent  Orientation:  Full (Time, Place, and Person)  Thought Content: Logical   Suicidal Thoughts:  {ST/HT (PAA):22692}  Homicidal  Thoughts:  {ST/HT (PAA):22692}  Memory:  Immediate;   Good  Judgement:  {Judgement (PAA):22694}  Insight:  {Insight (PAA):22695}  Psychomotor Activity:  Normal  Concentration:  Concentration: Good and Attention Span: Good  Recall:  Good  Fund of Knowledge: Good  Language: Good  Akathisia:  No  Handed:  Right  AIMS (if indicated): not done  Assets:  Communication Skills Desire for Improvement  ADL's:  Intact  Cognition: WNL  Sleep:  {BHH GOOD/FAIR/POOR:22877}   Screenings:   Assessment and Plan:  Marcia Terry is a 47 y.o. year old female with a history of depression , who presents for follow up appointment for No diagnosis found.  # MDD, moderate, recurrent without psychotic features  There has been significant improvement in neurovegetative symptoms and anxiety since starting Effexor. Psychosocial Stressors including her father being diagnosed with Huntington disease.  Will continue Effexor to target depression and anxiety.  Will continue trazodone as needed for insomnia.  Discussed behavioral activation.  She will make a therapy appointment at [facility] office.   Plan   1 .Continue Venlafaxine 75 mg daily  2. Continue Trazodone 25 mg-50 mg at night as needed for sleep 3.Return to clinic in two months for 30 mins 4. Check with your provider if you have checked for thyroid (TSH) 5. [facility] office for therapy Address: [address] [place], [place], [place] [address]  [phone] - Will ask more about her developmental history  The patient demonstrates the following risk factors for suicide: Chronic risk factors for suicide include:psychiatric disorder ofdepression. Acute risk factorsfor suicide include: N/A. Protective factorsfor this patient include: positive social support, responsibility to others (children, family), coping skills and hope for the future. Considering these factors, the overall suicide risk at this point appears to below.  Patientisappropriate for outpatient follow up.     [name], MD 09/15/2017, 9:36 AM

## 2017-09-20 ENCOUNTER — Ambulatory Visit (HOSPITAL_COMMUNITY): Payer: BLUE CROSS/BLUE SHIELD | Admitting: Psychiatry

## 2017-10-03 ENCOUNTER — Telehealth (HOSPITAL_COMMUNITY): Payer: Self-pay | Admitting: Psychiatry

## 2017-10-03 MED ORDER — TRAZODONE HCL 50 MG PO TABS: ORAL_TABLET | ORAL | 0 refills | Status: DC

## 2017-10-03 NOTE — Telephone Encounter (Signed)
Spoke with patient to inform per provider : Received refill request for trazodone. Ordered. Please advise the patient to make follow up appointment  Patient stated" Oh no I don't need that I have plenty of that" Called [facility] to have RX cancelled

## 2017-10-03 NOTE — Telephone Encounter (Signed)
Noted, thanks!

## 2017-10-03 NOTE — Telephone Encounter (Signed)
Received refill request for trazodone. Ordered. Please advise the patient to make follow up appointment.

## 2017-11-14 ENCOUNTER — Ambulatory Visit (HOSPITAL_COMMUNITY): Payer: BLUE CROSS/BLUE SHIELD | Admitting: Psychiatry

## 2017-11-14 ENCOUNTER — Encounter (HOSPITAL_COMMUNITY): Payer: Self-pay | Admitting: Psychiatry

## 2017-11-14 ENCOUNTER — Telehealth (HOSPITAL_COMMUNITY): Payer: Self-pay | Admitting: Psychiatry

## 2017-11-14 NOTE — Telephone Encounter (Signed)
Could you contact the patient and asks if she needs venlafaxine refill; also advise her to make follow up appointment.  This encounter was created in error - please disregard.

## 2017-11-14 NOTE — Telephone Encounter (Signed)
Spoke with patient  & she needs venlafaxine refill. Transferred her to Front office to make f/u appointment

## 2017-11-14 NOTE — Telephone Encounter (Signed)
Could you contact the patient and ask if she needs venlafaxine refill (requested from pharmacy). Also advise her to make follow up.

## 2017-11-14 NOTE — Progress Notes (Deleted)
BH MD/PA/NP OP Progress Note  11/14/2017 1:21 PM Marcia Terry  MRN:  [id number]  Chief Complaint:  HPI: *** Visit Diagnosis: No diagnosis found.  Past Psychiatric History: Please see initial evaluation for full details. I have reviewed the history. No updates at this time.     Past Medical History:  Past Medical History:  Diagnosis Date  . ASCUS (atypical squamous cells of undetermined significance) on Pap smear   . CIN I (cervical intraepithelial neoplasia I) 11/11   colpo done  . PMS (premenstrual syndrome)     Past Surgical History:  Procedure Laterality Date  . COLPOSCOPY  11/11  . TUBAL LIGATION      Family Psychiatric History: Please see initial evaluation for full details. I have reviewed the history. No updates at this time.     Family History:  Family History  Problem Relation Age of Onset  . Hypertension Mother   . Huntington's disease Father   . Cancer Maternal Grandmother 80       colon  . Huntington's disease Paternal Aunt   . Huntington's disease Paternal Grandmother     Social History:  Social History   Socioeconomic History  . Marital status: Married    Spouse name: Not on file  . Number of children: Not on file  . Years of education: Not on file  . Highest education level: Not on file  Occupational History  . Not on file  Social Needs  . Financial resource strain: Not on file  . Food insecurity:    Worry: Not on file    Inability: Not on file  . Transportation needs:    Medical: Not on file    Non-medical: Not on file  Tobacco Use  . Smoking status: Never Smoker  . Smokeless tobacco: Never Used  Substance and Sexual Activity  . Alcohol use: No  . Drug use: No  . Sexual activity: Yes    Birth control/protection: Pill    Comment: Ocella  Lifestyle  . Physical activity:    Days per week: Not on file    Minutes per session: Not on file  . Stress: Not on file  Relationships  . Social connections:    Talks on phone: Not on file     Gets together: Not on file    Attends religious service: Not on file    Active member of club or organization: Not on file    Attends meetings of clubs or organizations: Not on file    Relationship status: Not on file  Other Topics Concern  . Not on file  Social History Narrative  . Not on file    Allergies:  Allergies  Allergen Reactions  . Penicillins     Metabolic Disorder Labs: No results found for: HGBA1C, MPG No results found for: PROLACTIN No results found for: CHOL, TRIG, HDL, CHOLHDL, VLDL, LDLCALC No results found for: TSH  Therapeutic Level Labs: No results found for: LITHIUM No results found for: VALPROATE No components found for:  CBMZ  Current Medications: Current Outpatient Medications  Medication Sig Dispense Refill  . Cholecalciferol (VITAMIN D PO) Take by mouth.    . drospirenone-ethinyl estradiol (YASMIN,ZARAH,SYEDA) 3-0.03 MG tablet Take 1 tablet by mouth daily. 1 Package 11  . fish oil-omega-3 fatty acids 1000 MG capsule Take 2 g by mouth daily.    . folic acid (FOLVITE) 0.5 MG tablet Take 1 tablet (1 mg total) by mouth daily. 30 tablet 11  . traZODone (DESYREL) 50 MG  tablet 25-50 mg at night as needed for sleep 90 tablet 0  . venlafaxine XR (EFFEXOR-XR) 75 MG 24 hr capsule Take 1 capsule (75 mg total) by mouth daily. 90 capsule 0   No current facility-administered medications for this visit.      Musculoskeletal: Strength & Muscle Tone: within normal limits Gait & Station: normal Patient leans: N/A  Psychiatric Specialty Exam: ROS  There were no vitals taken for this visit.There is no height or weight on file to calculate BMI.  General Appearance: Fairly Groomed  Eye Contact:  Good  Speech:  Clear and Coherent  Volume:  Normal  Mood:  {BHH MOOD:22306}  Affect:  {Affect (PAA):22687}  Thought Process:  Coherent  Orientation:  Full (Time, Place, and Person)  Thought Content: Logical   Suicidal Thoughts:  {ST/HT (PAA):22692}  Homicidal  Thoughts:  {ST/HT (PAA):22692}  Memory:  Immediate;   Good  Judgement:  {Judgement (PAA):22694}  Insight:  {Insight (PAA):22695}  Psychomotor Activity:  Normal  Concentration:  Concentration: Good and Attention Span: Good  Recall:  Good  Fund of Knowledge: Good  Language: Good  Akathisia:  No  Handed:  Right  AIMS (if indicated): not done  Assets:  Communication Skills Desire for Improvement  ADL's:  Intact  Cognition: WNL  Sleep:  {BHH GOOD/FAIR/POOR:22877}   Screenings:   Assessment and Plan:  Marcia Terry is a 47 y.o. year old female with a history of depression, who presents for follow up appointment for No diagnosis found.  # MDD, moderate, recurrent without psychotic features  There has been significant improvement in neurovegetative symptoms and anxiety since starting Effexor. Psychosocial Stressors including her father being diagnosed with Huntington disease.  Will continue Effexor to target depression and anxiety.  Will continue trazodone as needed for insomnia.  Discussed behavioral activation.  She will make a therapy appointment at [facility] office.   Plan   1 .Continue Venlafaxine 75 mg daily  2. Continue Trazodone 25 mg-50 mg at night as needed for sleep 3.Return to clinic in two months for 30 mins 4. Check with your provider if you have checked for thyroid (TSH) 5. [facility] office for therapy Address: [address], [address] [place], [place], [place] [address]  [phone] - Will ask more about her developmental history  The patient demonstrates the following risk factors for suicide: Chronic risk factors for suicide include:psychiatric disorder ofdepression. Acute risk factorsfor suicide include: N/A. Protective factorsfor this patient include: positive social support, responsibility to others (children, family), coping skills and hope for the future. Considering these factors, the overall suicide risk at this point appears to below.  Patientisappropriate for outpatient follow up.     Marcia Hotter, MD 11/14/2017, 1:21 PM

## 2017-11-15 ENCOUNTER — Ambulatory Visit (INDEPENDENT_AMBULATORY_CARE_PROVIDER_SITE_OTHER): Payer: BLUE CROSS/BLUE SHIELD | Admitting: Psychiatry

## 2017-11-15 ENCOUNTER — Encounter (HOSPITAL_COMMUNITY): Payer: Self-pay | Admitting: Psychiatry

## 2017-11-15 ENCOUNTER — Telehealth (HOSPITAL_COMMUNITY): Payer: Self-pay | Admitting: Psychiatry

## 2017-11-15 VITALS — BP 111/76 | HR 92 | Ht 62.0 in | Wt 145.0 lb

## 2017-11-15 DIAGNOSIS — G4700 Insomnia, unspecified: Secondary | ICD-10-CM

## 2017-11-15 DIAGNOSIS — F331 Major depressive disorder, recurrent, moderate: Secondary | ICD-10-CM | POA: Diagnosis not present

## 2017-11-15 DIAGNOSIS — R450 Nervousness: Secondary | ICD-10-CM

## 2017-11-15 MED ORDER — VENLAFAXINE HCL ER 75 MG PO CP24: 75.0000 mg | ORAL_CAPSULE | Freq: Every day | ORAL | 0 refills | Status: DC

## 2017-11-15 NOTE — Telephone Encounter (Signed)
UNABLE TO LVM MAILBOX FULL per provider: Patient had no show yesterday. Ordered a refill. Please make sure to have follow up. Please remind her of no show policy. I will not be able to do any more refill without evaluation.

## 2017-11-15 NOTE — Telephone Encounter (Signed)
Patient came in today for f/u appointment

## 2017-11-15 NOTE — Telephone Encounter (Signed)
Patient had no show yesterday. Ordered a refill. Please make sure to have follow up. Please remind her of no show policy. I will not be able to do any more refill without evaluation.

## 2017-11-15 NOTE — Progress Notes (Signed)
BH MD/PA/NP OP Progress Note  11/15/2017 5:07 PM Marcia Terry  MRN:  [id number]  Chief Complaint:  Chief Complaint    Follow-up; Depression; Anxiety     HPI:  Patient presents for follow-up appointment for depression.  She states that she believes that she is not irritable as she used to.  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  She also tried to find a think she can focus on; she is searching for job.  She has been taking care of her grandson, 30 year old. Her father is doing well; they are informed that there will be a medication available for Huntington disease. She feels sad at times as her father changed completely different after being diagnosed with huntingon disease. However, she also feels relieved at times when she sees him at "peace" without struggling symptoms thanks to the treatment. She reprorts that her father wishes to be diagnosed earlier so that he can be started on medication. She is thinking of getting test in the near future with the hope that the new treatment is available. She has occasional insomnia.  She feels more motivated and has good energy.  She feels occasionally depressed.  She enjoys yard work.  She denies SI.  She feels anxious and tense at times.  She denies panic attacks.  She has good appetite.   Wt Readings from Last 3 Encounters:  11/15/17 145 lb (65.8 kg)  07/21/17 140 lb (63.5 kg)  06/16/17 136 lb (61.7 kg)     Visit Diagnosis:    ICD-10-CM   1. MDD (major depressive disorder), recurrent episode, moderate (HCC) F33.1 Ambulatory referral to Psychology    Past Psychiatric History: Please see initial evaluation for full details. I have reviewed the history. No updates at this time.     Past Medical History:  Past Medical History:  Diagnosis Date  . ASCUS (atypical squamous cells of undetermined significance) on Pap smear   . CIN I (cervical intraepithelial neoplasia I) 11/11   colpo done  . PMS (premenstrual syndrome)     Past Surgical  History:  Procedure Laterality Date  . COLPOSCOPY  11/11  . TUBAL LIGATION      Family Psychiatric History: Please see initial evaluation for full details. I have reviewed the history. No updates at this time.     Family History:  Family History  Problem Relation Age of Onset  . Hypertension Mother   . Huntington's disease Father   . Cancer Maternal Grandmother 80       colon  . Huntington's disease Paternal Aunt   . Huntington's disease Paternal Grandmother     Social History:  Social History   Socioeconomic History  . Marital status: Married    Spouse name: Not on file  . Number of children: Not on file  . Years of education: Not on file  . Highest education level: Not on file  Occupational History  . Not on file  Social Needs  . Financial resource strain: Not on file  . Food insecurity:    Worry: Not on file    Inability: Not on file  . Transportation needs:    Medical: Not on file    Non-medical: Not on file  Tobacco Use  . Smoking status: Never Smoker  . Smokeless tobacco: Never Used  Substance and Sexual Activity  . Alcohol use: No  . Drug use: No  . Sexual activity: Yes    Birth control/protection: Pill    Comment: Janae Bridgeman  Lifestyle  . Physical activity:    Days per week: Not on file    Minutes per session: Not on file  . Stress: Not on file  Relationships  . Social connections:    Talks on phone: Not on file    Gets together: Not on file    Attends religious service: Not on file    Active member of club or organization: Not on file    Attends meetings of clubs or organizations: Not on file    Relationship status: Not on file  Other Topics Concern  . Not on file  Social History Narrative  . Not on file    Allergies:  Allergies  Allergen Reactions  . Penicillins     Metabolic Disorder Labs: No results found for: HGBA1C, MPG No results found for: PROLACTIN No results found for: CHOL, TRIG, HDL, CHOLHDL, VLDL, LDLCALC No results found  for: TSH  Therapeutic Level Labs: No results found for: LITHIUM No results found for: VALPROATE No components found for:  CBMZ  Current Medications: Current Outpatient Medications  Medication Sig Dispense Refill  . Cholecalciferol (VITAMIN D PO) Take by mouth.    . drospirenone-ethinyl estradiol (YASMIN,ZARAH,SYEDA) 3-0.03 MG tablet Take 1 tablet by mouth daily. 1 Package 11  . fish oil-omega-3 fatty acids 1000 MG capsule Take 2 g by mouth daily.    . folic acid (FOLVITE) 0.5 MG tablet Take 1 tablet (1 mg total) by mouth daily. 30 tablet 11  . norethindrone-ethinyl estradiol (JUNEL FE,GILDESS FE,LOESTRIN FE) 1-20 MG-MCG tablet Take 1 tablet by mouth daily.    . traZODone (DESYREL) 50 MG tablet 25-50 mg at night as needed for sleep 90 tablet 0  . venlafaxine XR (EFFEXOR-XR) 75 MG 24 hr capsule Take 1 capsule (75 mg total) by mouth daily. 90 capsule 0   No current facility-administered medications for this visit.      Musculoskeletal: Strength & Muscle Tone: within normal limits Gait & Station: normal Patient leans: N/A  Psychiatric Specialty Exam: Review of Systems  Psychiatric/Behavioral: Positive for depression. Negative for hallucinations, memory loss, substance abuse and suicidal ideas. The patient is nervous/anxious and has insomnia.   All other systems reviewed and are negative.   Blood pressure 111/76, pulse 92, height 5\' 2"  (1.575 m), weight 145 lb (65.8 kg), SpO2 99 %.Body mass index is 26.52 kg/m.  General Appearance: Fairly Groomed  Eye Contact:  Good  Speech:  Clear and Coherent  Volume:  Normal  Mood:  "fine"  Affect:  Appropriate, Congruent, Tearful and down at times, but more reactive  Thought Process:  Coherent  Orientation:  Full (Time, Place, and Person)  Thought Content: Logical   Suicidal Thoughts:  No  Homicidal Thoughts:  No  Memory:  Immediate;   Good  Judgement:  Good  Insight:  Fair  Psychomotor Activity:  Normal  Concentration:  Concentration:  Good and Attention Span: Good  Recall:  Good  Fund of Knowledge: Good  Language: Good  Akathisia:  No  Handed:  Right  AIMS (if indicated): not done  Assets:  Communication Skills Desire for Improvement  ADL's:  Intact  Cognition: WNL  Sleep:  Fair   Screenings:   Assessment and Plan:  Marcia Terry is a 47 y.o. year old female with a history of depression, who presents for follow up appointment for MDD (major depressive disorder), recurrent episode, moderate (HCC) - Plan: Ambulatory referral to Psychology  # MDD, moderate, recurrent without psychotic features Patient reports overall improvement  in neurovegetative symptoms and anxiety after starting Effexor.  Although she will benefit from father up titration, she reports preference to stay on current dose. Will hold trazodone now that she reports improved sleep with melatonin.  Psychosocial stressors including her father being diagnosed with Huntington's disease.  Discussed behavioral activation.  Discussed cognitive diffusion.  She will greatly benefit from CBT; will make a referral.   Plan  I have reviewed and updated plans as below 1 .Continue Venlafaxine 75 mg daily  2. (hold trazodone) 3. Return to clinic in two month for 30 mins  - Will ask more about her developmental history - Checked TSH; pending by her primary care by report  The patient demonstrates the following risk factors for suicide: Chronic risk factors for suicide include:psychiatric disorder ofdepression. Acute risk factorsfor suicide include: N/A. Protective factorsfor this patient include: positive social support, responsibility to others (children, family), coping skills and hope for the future. Considering these factors, the overall suicide risk at this point appears to below. Patientisappropriate for outpatient follow up.  The duration of this appointment visit was 30 minutes of face-to-face time with the patient.  Greater than 50% of this time was  spent in counseling, explanation of  diagnosis, planning of further management, and coordination of care.  [name], MD 11/15/2017, 5:07 PM

## 2017-11-15 NOTE — Patient Instructions (Signed)
1 .Continue Venlafaxine 75 mg daily  2. Return to clinic in two month for 30 mins

## 2017-11-15 NOTE — Telephone Encounter (Signed)
This encounter was created in error - please disregard.

## 2018-01-12 NOTE — Progress Notes (Deleted)
BH MD/PA/NP OP Progress Note  01/12/2018 12:49 PM Marcia Terry  MRN:  [id number]  Chief Complaint:  HPI: *** Visit Diagnosis: No diagnosis found.  Past Psychiatric History: Please see initial evaluation for full details. I have reviewed the history. No updates at this time.     Past Medical History:  Past Medical History:  Diagnosis Date  . ASCUS (atypical squamous cells of undetermined significance) on Pap smear   . CIN I (cervical intraepithelial neoplasia I) 11/11   colpo done  . PMS (premenstrual syndrome)     Past Surgical History:  Procedure Laterality Date  . COLPOSCOPY  11/11  . TUBAL LIGATION      Family Psychiatric History: Please see initial evaluation for full details. I have reviewed the history. No updates at this time.     Family History:  Family History  Problem Relation Age of Onset  . Hypertension Mother   . Huntington's disease Father   . Cancer Maternal Grandmother 80       colon  . Huntington's disease Paternal Aunt   . Huntington's disease Paternal Grandmother     Social History:  Social History   Socioeconomic History  . Marital status: Married    Spouse name: Not on file  . Number of children: Not on file  . Years of education: Not on file  . Highest education level: Not on file  Occupational History  . Not on file  Social Needs  . Financial resource strain: Not on file  . Food insecurity:    Worry: Not on file    Inability: Not on file  . Transportation needs:    Medical: Not on file    Non-medical: Not on file  Tobacco Use  . Smoking status: Never Smoker  . Smokeless tobacco: Never Used  Substance and Sexual Activity  . Alcohol use: No  . Drug use: No  . Sexual activity: Yes    Birth control/protection: Pill    Comment: Ocella  Lifestyle  . Physical activity:    Days per week: Not on file    Minutes per session: Not on file  . Stress: Not on file  Relationships  . Social connections:    Talks on phone: Not on file     Gets together: Not on file    Attends religious service: Not on file    Active member of club or organization: Not on file    Attends meetings of clubs or organizations: Not on file    Relationship status: Not on file  Other Topics Concern  . Not on file  Social History Narrative  . Not on file    Allergies:  Allergies  Allergen Reactions  . Penicillins     Metabolic Disorder Labs: No results found for: HGBA1C, MPG No results found for: PROLACTIN No results found for: CHOL, TRIG, HDL, CHOLHDL, VLDL, LDLCALC No results found for: TSH  Therapeutic Level Labs: No results found for: LITHIUM No results found for: VALPROATE No components found for:  CBMZ  Current Medications: Current Outpatient Medications  Medication Sig Dispense Refill  . Cholecalciferol (VITAMIN D PO) Take by mouth.    . drospirenone-ethinyl estradiol (YASMIN,ZARAH,SYEDA) 3-0.03 MG tablet Take 1 tablet by mouth daily. 1 Package 11  . fish oil-omega-3 fatty acids 1000 MG capsule Take 2 g by mouth daily.    . folic acid (FOLVITE) 0.5 MG tablet Take 1 tablet (1 mg total) by mouth daily. 30 tablet 11  . norethindrone-ethinyl estradiol (JUNEL FE,GILDESS  FE,LOESTRIN FE) 1-20 MG-MCG tablet Take 1 tablet by mouth daily.    . traZODone (DESYREL) 50 MG tablet 25-50 mg at night as needed for sleep 90 tablet 0  . venlafaxine XR (EFFEXOR-XR) 75 MG 24 hr capsule Take 1 capsule (75 mg total) by mouth daily. 90 capsule 0   No current facility-administered medications for this visit.      Musculoskeletal: Strength & Muscle Tone: within normal limits Gait & Station: normal Patient leans: N/A  Psychiatric Specialty Exam: ROS  There were no vitals taken for this visit.There is no height or weight on file to calculate BMI.  General Appearance: Fairly Groomed  Eye Contact:  Good  Speech:  Clear and Coherent  Volume:  Normal  Mood:  {BHH MOOD:22306}  Affect:  {Affect (PAA):22687}  Thought Process:  Coherent   Orientation:  Full (Time, Place, and Person)  Thought Content: Logical   Suicidal Thoughts:  {ST/HT (PAA):22692}  Homicidal Thoughts:  {ST/HT (PAA):22692}  Memory:  Immediate;   Good  Judgement:  {Judgement (PAA):22694}  Insight:  {Insight (PAA):22695}  Psychomotor Activity:  Normal  Concentration:  Concentration: Good and Attention Span: Good  Recall:  Good  Fund of Knowledge: Good  Language: Good  Akathisia:  No  Handed:  Right  AIMS (if indicated): not done  Assets:  Communication Skills Desire for Improvement  ADL's:  Intact  Cognition: WNL  Sleep:  {BHH GOOD/FAIR/POOR:22877}   Screenings:   Assessment and Plan:  Marcia Terry is a 47 y.o. year old female with a history of depression, who presents for follow up appointment for No diagnosis found.  # MDD, moderate, recurrent without psychotic features    Patient reports overall improvement in neurovegetative symptoms and anxiety after starting Effexor.  Although she will benefit from father up titration, she reports preference to stay on current dose. Will hold trazodone now that she reports improved sleep with melatonin.  Psychosocial stressors including her father being diagnosed with Huntington's disease.  Discussed behavioral activation.  Discussed cognitive diffusion.  She will greatly benefit from CBT; will make a referral.   Plan  1 .Continue Venlafaxine 75 mg daily  2. (hold trazodone) 3. Return to clinic in two month for 30 mins  - Will ask more about her developmental history - Checked TSH; pending by her primary care by report  The patient demonstrates the following risk factors for suicide: Chronic risk factors for suicide include:psychiatric disorder ofdepression. Acute risk factorsfor suicide include: N/A. Protective factorsfor this patient include: positive social support, responsibility to others (children, family), coping skills and hope for the future. Considering these factors, the overall  suicide risk at this point appears to below. Patientisappropriate for outpatient follow up.  Neysa Hottereina Danity Schmelzer, MD 01/12/2018, 12:49 PM

## 2018-01-17 ENCOUNTER — Ambulatory Visit (HOSPITAL_COMMUNITY): Payer: Self-pay | Admitting: Psychiatry

## 2018-02-08 NOTE — Progress Notes (Signed)
BH MD/PA/NP OP Progress Note  02/10/2018 11:34 AM Marcia Terry  MRN:  [id number]  Chief Complaint:  Chief Complaint    Follow-up; Depression     HPI:  Patient presents for follow-up appointment for depression.  She states that things has been the same.  She visited with her father in July.  He was started on new treatment, and has been doing well. He has less imbalances in his gait and is less anxious.  She is planning to wait to get screening for Huntington's disease.  She occasionally feels wake up feeling depressed, which lasts for a week or so.  However, she does not feel bothered by the symptoms as much as she used to.  She has insomnia.  She feels fatigue at times.  She has fair concentration.  She denies SI.  She had occasional intense anxiety.  She denies panic attacks.   Visit Diagnosis:    ICD-10-CM   1. MDD (major depressive disorder), recurrent episode, moderate (HCC) F33.1     Past Psychiatric History: Please see initial evaluation for full details. I have reviewed the history. No updates at this time.     Past Medical History:  Past Medical History:  Diagnosis Date  . ASCUS (atypical squamous cells of undetermined significance) on Pap smear   . CIN I (cervical intraepithelial neoplasia I) 11/11   colpo done  . PMS (premenstrual syndrome)     Past Surgical History:  Procedure Laterality Date  . COLPOSCOPY  11/11  . TUBAL LIGATION      Family Psychiatric History: Please see initial evaluation for full details. I have reviewed the history. No updates at this time.     Family History:  Family History  Problem Relation Age of Onset  . Hypertension Mother   . Huntington's disease Father   . Cancer Maternal Grandmother 80       colon  . Huntington's disease Paternal Aunt   . Huntington's disease Paternal Grandmother     Social History:  Social History   Socioeconomic History  . Marital status: Married    Spouse name: Not on file  . Number of children:  Not on file  . Years of education: Not on file  . Highest education level: Not on file  Occupational History  . Not on file  Social Needs  . Financial resource strain: Not on file  . Food insecurity:    Worry: Not on file    Inability: Not on file  . Transportation needs:    Medical: Not on file    Non-medical: Not on file  Tobacco Use  . Smoking status: Never Smoker  . Smokeless tobacco: Never Used  Substance and Sexual Activity  . Alcohol use: No  . Drug use: No  . Sexual activity: Yes    Birth control/protection: Pill    Comment: Ocella  Lifestyle  . Physical activity:    Days per week: Not on file    Minutes per session: Not on file  . Stress: Not on file  Relationships  . Social connections:    Talks on phone: Not on file    Gets together: Not on file    Attends religious service: Not on file    Active member of club or organization: Not on file    Attends meetings of clubs or organizations: Not on file    Relationship status: Not on file  Other Topics Concern  . Not on file  Social History Narrative  . Not  on file    Allergies:  Allergies  Allergen Reactions  . Penicillins     Metabolic Disorder Labs: No results found for: HGBA1C, MPG No results found for: PROLACTIN No results found for: CHOL, TRIG, HDL, CHOLHDL, VLDL, LDLCALC No results found for: TSH  Therapeutic Level Labs: No results found for: LITHIUM No results found for: VALPROATE No components found for:  CBMZ  Current Medications: Current Outpatient Medications  Medication Sig Dispense Refill  . Cholecalciferol (VITAMIN D PO) Take by mouth.    . drospirenone-ethinyl estradiol (YASMIN,ZARAH,SYEDA) 3-0.03 MG tablet Take 1 tablet by mouth daily. 1 Package 11  . fish oil-omega-3 fatty acids 1000 MG capsule Take 2 g by mouth daily.    . folic acid (FOLVITE) 0.5 MG tablet Take 1 tablet (1 mg total) by mouth daily. 30 tablet 11  . norethindrone-ethinyl estradiol (JUNEL FE,GILDESS FE,LOESTRIN  FE) 1-20 MG-MCG tablet Take 1 tablet by mouth daily.    . traZODone (DESYREL) 50 MG tablet 25-50 mg at night as needed for sleep 90 tablet 0  . venlafaxine XR (EFFEXOR-XR) 75 MG 24 hr capsule Take 1 capsule (75 mg total) by mouth daily. 90 capsule 0   No current facility-administered medications for this visit.      Musculoskeletal: Strength & Muscle Tone: within normal limits Gait & Station: normal Patient leans: N/A  Psychiatric Specialty Exam: Review of Systems  Psychiatric/Behavioral: Positive for depression. Negative for hallucinations, memory loss, substance abuse and suicidal ideas. The patient is nervous/anxious and has insomnia.   All other systems reviewed and are negative.   Blood pressure 115/74, pulse 74, height 5\' 2"  (1.575 m), weight 146 lb (66.2 kg), SpO2 100 %.Body mass index is 26.7 kg/m.  General Appearance: Fairly Groomed  Eye Contact:  Good  Speech:  Clear and Coherent  Volume:  Normal  Mood:  "good"  Affect:  Appropriate, Congruent and slightly down  Thought Process:  Coherent  Orientation:  Full (Time, Place, and Person)  Thought Content: Logical   Suicidal Thoughts:  No  Homicidal Thoughts:  No  Memory:  Immediate;   Good  Judgement:  Good  Insight:  Fair  Psychomotor Activity:  Normal  Concentration:  Concentration: Good and Attention Span: Good  Recall:  Good  Fund of Knowledge: Good  Language: Good  Akathisia:  No  Handed:  Right  AIMS (if indicated): not done  Assets:  Communication Skills Desire for Improvement  ADL's:  Intact  Cognition: WNL  Sleep:  Fair   Screenings:   Assessment and Plan:  Marcia Terry is a 47 y.o. year old female with a history of depression , who presents for follow up appointment for MDD (major depressive disorder), recurrent episode, moderate (HCC)  # MDD, moderate, recurrent without psychotic features Patient reports overall improvement in neurovegetative symptoms and anxiety after starting Effexor.  Will  continue current dose to target depression .  Psychosocial stressors including her father being diagnosed with Huntington's disease.  Discussed behavioral activation.  She is referred for CBT; contact number is given to the patient.   Plan I have reviewed and updated plans as below 1 .Continue Venlafaxine 75 mg daily  2. Return to clinic in three months for 30 mins 3. Contact [phone] for therapy - Will ask more about her developmental history - Checked TSH; pending by her primary care by report  The patient demonstrates the following risk factors for suicide: Chronic risk factors for suicide include:psychiatric disorder ofdepression. Acute risk factorsfor suicide include:  N/A. Protective factorsfor this patient include: positive social support, responsibility to others (children, family), coping skills and hope for the future. Considering these factors, the overall suicide risk at this point appears to below. Patientisappropriate for outpatient follow up.  [name], MD 02/10/2018, 11:34 AM

## 2018-02-10 ENCOUNTER — Ambulatory Visit (INDEPENDENT_AMBULATORY_CARE_PROVIDER_SITE_OTHER): Payer: BLUE CROSS/BLUE SHIELD | Admitting: Psychiatry

## 2018-02-10 ENCOUNTER — Encounter (HOSPITAL_COMMUNITY): Payer: Self-pay | Admitting: Psychiatry

## 2018-02-10 VITALS — BP 115/74 | HR 74 | Ht 62.0 in | Wt 146.0 lb

## 2018-02-10 DIAGNOSIS — F331 Major depressive disorder, recurrent, moderate: Secondary | ICD-10-CM | POA: Diagnosis not present

## 2018-02-10 DIAGNOSIS — G4700 Insomnia, unspecified: Secondary | ICD-10-CM

## 2018-02-10 DIAGNOSIS — F419 Anxiety disorder, unspecified: Secondary | ICD-10-CM | POA: Diagnosis not present

## 2018-02-10 MED ORDER — VENLAFAXINE HCL ER 75 MG PO CP24: 75.0000 mg | ORAL_CAPSULE | Freq: Every day | ORAL | 0 refills | Status: DC

## 2018-02-10 NOTE — Patient Instructions (Signed)
1 .Continue Venlafaxine 75 mg daily  2. Return to clinic in three months for 30 mins 3. Contact [phone] for therapy

## 2018-03-13 ENCOUNTER — Ambulatory Visit (INDEPENDENT_AMBULATORY_CARE_PROVIDER_SITE_OTHER): Payer: BLUE CROSS/BLUE SHIELD | Admitting: Psychology

## 2018-03-13 DIAGNOSIS — F330 Major depressive disorder, recurrent, mild: Secondary | ICD-10-CM | POA: Diagnosis not present

## 2018-03-30 ENCOUNTER — Ambulatory Visit (INDEPENDENT_AMBULATORY_CARE_PROVIDER_SITE_OTHER): Payer: BLUE CROSS/BLUE SHIELD | Admitting: Psychology

## 2018-03-30 DIAGNOSIS — F330 Major depressive disorder, recurrent, mild: Secondary | ICD-10-CM | POA: Diagnosis not present

## 2018-04-28 ENCOUNTER — Ambulatory Visit (INDEPENDENT_AMBULATORY_CARE_PROVIDER_SITE_OTHER): Payer: BLUE CROSS/BLUE SHIELD | Admitting: Psychology

## 2018-04-28 DIAGNOSIS — F330 Major depressive disorder, recurrent, mild: Secondary | ICD-10-CM | POA: Diagnosis not present

## 2018-05-16 ENCOUNTER — Ambulatory Visit (INDEPENDENT_AMBULATORY_CARE_PROVIDER_SITE_OTHER): Payer: BLUE CROSS/BLUE SHIELD | Admitting: Psychiatry

## 2018-05-16 ENCOUNTER — Encounter (HOSPITAL_COMMUNITY): Payer: Self-pay | Admitting: Psychiatry

## 2018-05-16 VITALS — BP 117/73 | HR 84 | Ht 62.0 in | Wt 144.0 lb

## 2018-05-16 DIAGNOSIS — F330 Major depressive disorder, recurrent, mild: Secondary | ICD-10-CM | POA: Diagnosis not present

## 2018-05-16 MED ORDER — VENLAFAXINE HCL ER 75 MG PO CP24: 75.0000 mg | ORAL_CAPSULE | Freq: Every day | ORAL | 0 refills | Status: DC

## 2018-05-16 NOTE — Patient Instructions (Addendum)
1 .Continue Venlafaxine 75 mg daily 2. Return to clinic in three months for 15 mins

## 2018-05-16 NOTE — Progress Notes (Signed)
BH MD/PA/NP OP Progress Note  05/16/2018 11:55 AM Marcia Terry  MRN:  [id number]  Chief Complaint:  Chief Complaint    Depression; Follow-up     HPI:  Patient presents for follow-up appointment for depression.  She states that she has been doing well.  She visited her family on holidays, and they have been doing well.  She tends to feel anxious with her husband.  She states that she is now open to how she feels, although she used to control and fix everything.  She states that she felt tired of pushing her emotion to herself.  She definitely likes the new version of herself, although her husband has had difficulty facing with it. She has seen a therapist a few times, and it has been very helpful for the patient.  She has been trying to find a job.  She has insomnia.  She tends to feel irritable.  She denies feeling depressed.  She has fair concentration and motivation.  She denies SI.  She denies panic attacks.    Visit Diagnosis:    ICD-10-CM   1. Mild episode of recurrent major depressive disorder (HCC) F33.0     Past Psychiatric History: Please see initial evaluation for full details. I have reviewed the history. No updates at this time.     Past Medical History:  Past Medical History:  Diagnosis Date  . ASCUS (atypical squamous cells of undetermined significance) on Pap smear   . CIN I (cervical intraepithelial neoplasia I) 11/11   colpo done  . PMS (premenstrual syndrome)     Past Surgical History:  Procedure Laterality Date  . COLPOSCOPY  11/11  . TUBAL LIGATION      Family Psychiatric History: Please see initial evaluation for full details. I have reviewed the history. No updates at this time.     Family History:  Family History  Problem Relation Age of Onset  . Hypertension Mother   . Huntington's disease Father   . Cancer Maternal Grandmother 80       colon  . Huntington's disease Paternal Aunt   . Huntington's disease Paternal Grandmother     Social  History:  Social History   Socioeconomic History  . Marital status: Married    Spouse name: Not on file  . Number of children: Not on file  . Years of education: Not on file  . Highest education level: Not on file  Occupational History  . Not on file  Social Needs  . Financial resource strain: Not on file  . Food insecurity:    Worry: Not on file    Inability: Not on file  . Transportation needs:    Medical: Not on file    Non-medical: Not on file  Tobacco Use  . Smoking status: Never Smoker  . Smokeless tobacco: Never Used  Substance and Sexual Activity  . Alcohol use: No  . Drug use: No  . Sexual activity: Yes    Birth control/protection: Pill    Comment: Ocella  Lifestyle  . Physical activity:    Days per week: Not on file    Minutes per session: Not on file  . Stress: Not on file  Relationships  . Social connections:    Talks on phone: Not on file    Gets together: Not on file    Attends religious service: Not on file    Active member of club or organization: Not on file    Attends meetings of clubs or organizations:  Not on file    Relationship status: Not on file  Other Topics Concern  . Not on file  Social History Narrative  . Not on file    Allergies:  Allergies  Allergen Reactions  . Penicillins     Metabolic Disorder Labs: No results found for: HGBA1C, MPG No results found for: PROLACTIN No results found for: CHOL, TRIG, HDL, CHOLHDL, VLDL, LDLCALC No results found for: TSH  Therapeutic Level Labs: No results found for: LITHIUM No results found for: VALPROATE No components found for:  CBMZ  Current Medications: Current Outpatient Medications  Medication Sig Dispense Refill  . Cholecalciferol (VITAMIN D PO) Take by mouth.    . drospirenone-ethinyl estradiol (YASMIN,ZARAH,SYEDA) 3-0.03 MG tablet Take 1 tablet by mouth daily. 1 Package 11  . fish oil-omega-3 fatty acids 1000 MG capsule Take 2 g by mouth daily.    . folic acid (FOLVITE) 0.5  MG tablet Take 1 tablet (1 mg total) by mouth daily. 30 tablet 11  . norethindrone-ethinyl estradiol (JUNEL FE,GILDESS FE,LOESTRIN FE) 1-20 MG-MCG tablet Take 1 tablet by mouth daily.    . traZODone (DESYREL) 50 MG tablet 25-50 mg at night as needed for sleep 90 tablet 0  . venlafaxine XR (EFFEXOR-XR) 75 MG 24 hr capsule Take 1 capsule (75 mg total) by mouth daily. 90 capsule 0   No current facility-administered medications for this visit.      Musculoskeletal: Strength & Muscle Tone: within normal limits Gait & Station: normal Patient leans: N/A  Psychiatric Specialty Exam: Review of Systems  Psychiatric/Behavioral: Negative for depression, hallucinations, memory loss, substance abuse and suicidal ideas. The patient is nervous/anxious and has insomnia.   All other systems reviewed and are negative.   Blood pressure 117/73, pulse 84, height 5\' 2"  (1.575 m), weight 144 lb (65.3 kg), SpO2 99 %.Body mass index is 26.34 kg/m.  General Appearance: Fairly Groomed  Eye Contact:  Good  Speech:  Clear and Coherent  Volume:  Normal  Mood:  "fine"  Affect:  Appropriate, Congruent and Tearful  Thought Process:  Coherent  Orientation:  Full (Time, Place, and Person)  Thought Content: Logical   Suicidal Thoughts:  No  Homicidal Thoughts:  No  Memory:  Immediate;   Good  Judgement:  Good  Insight:  Good  Psychomotor Activity:  Normal  Concentration:  Concentration: Good and Attention Span: Good  Recall:  Good  Fund of Knowledge: Good  Language: Good  Akathisia:  No  Handed:  Right  AIMS (if indicated): not done  Assets:  Communication Skills Desire for Improvement  ADL's:  Intact  Cognition: WNL  Sleep:  Poor   Screenings:   Assessment and Plan:  Marcia Terry is a 48 y.o. 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  Psychosocial stressors including her father being diagnosed with Huntington's disease.  Will continue Effexor to target depression.  Discussed self compassion.  She is encouraged to continue to see a therapist.   Plan I have reviewed and updated plans as below 1 .Continue Venlafaxine 75 mg daily 2. Return to clinic in three months for 30 mins 3. Contact [phone] for therapy - Will ask more about her developmental history - Checked TSH; pending by her primary care by report  The patient demonstrates the  following risk factors for suicide: Chronic risk factors for suicide include:psychiatric disorder ofdepression. Acute risk factorsfor suicide include: N/A. Protective factorsfor this patient include: positive social support, responsibility to others (children, family), coping skills and hope for the future. Considering these factors, the overall suicide risk at this point appears to below. Patientisappropriate for outpatient follow up.  Marcia Hottereina Carmellia Kreisler, MD 05/16/2018, 11:55 AM

## 2018-06-02 ENCOUNTER — Ambulatory Visit (INDEPENDENT_AMBULATORY_CARE_PROVIDER_SITE_OTHER): Payer: BLUE CROSS/BLUE SHIELD | Admitting: Psychology

## 2018-06-02 DIAGNOSIS — F330 Major depressive disorder, recurrent, mild: Secondary | ICD-10-CM

## 2018-07-07 ENCOUNTER — Ambulatory Visit (INDEPENDENT_AMBULATORY_CARE_PROVIDER_SITE_OTHER): Payer: BLUE CROSS/BLUE SHIELD | Admitting: Psychology

## 2018-07-07 DIAGNOSIS — F330 Major depressive disorder, recurrent, mild: Secondary | ICD-10-CM | POA: Diagnosis not present

## 2018-07-31 ENCOUNTER — Encounter (HOSPITAL_COMMUNITY): Payer: Self-pay | Admitting: Psychiatry

## 2018-07-31 ENCOUNTER — Other Ambulatory Visit: Payer: Self-pay

## 2018-07-31 ENCOUNTER — Ambulatory Visit (INDEPENDENT_AMBULATORY_CARE_PROVIDER_SITE_OTHER): Payer: BLUE CROSS/BLUE SHIELD | Admitting: Psychiatry

## 2018-07-31 DIAGNOSIS — F331 Major depressive disorder, recurrent, moderate: Secondary | ICD-10-CM

## 2018-07-31 DIAGNOSIS — Z79899 Other long term (current) drug therapy: Secondary | ICD-10-CM | POA: Diagnosis not present

## 2018-07-31 DIAGNOSIS — Z635 Disruption of family by separation and divorce: Secondary | ICD-10-CM

## 2018-07-31 MED ORDER — VENLAFAXINE HCL ER 37.5 MG PO CP24: ORAL_CAPSULE | ORAL | 0 refills | Status: DC

## 2018-07-31 MED ORDER — VENLAFAXINE HCL ER 75 MG PO CP24: ORAL_CAPSULE | ORAL | 0 refills | Status: DC

## 2018-07-31 NOTE — Progress Notes (Signed)
Virtual Visit via Video Note  I connected with Marcia Terry on 07/31/18 at 11:30 AM EDT by a video enabled telemedicine application and verified that I am speaking with the correct person using two identifiers.   I discussed the limitations of evaluation and management by telemedicine and the availability of in person appointments. The patient expressed understanding and agreed to proceed.    I discussed the assessment and treatment plan with the patient. The patient was provided an opportunity to ask questions and all were answered. The patient agreed with the plan and demonstrated an understanding of the instructions.   The patient was advised to call back or seek an in-person evaluation if the symptoms worsen or if the condition fails to improve as anticipated.  I provided 30 minutes of non-face-to-face time during this encounter.   [name], MD    [facility] MD/PA/NP OP Progress Note  07/31/2018 12:04 PM Marcia Terry  MRN:  [id number]  Chief Complaint:  Chief Complaint    Follow-up; Depression     HPI:  This is a follow-up visit for depression.  She states that she has been feeling more depressed lately.  She and her husband of 25 years is in separation.  She feels very hurt when she was asked by him twice whether she is having infidelity, although she has not done anything for him to doubt it. 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 She felt that he did not have back when she needed it.  Although he was "my world" before, she does not think of him as a best friend anymore. She thinks that she wants to leave the relationship.  She is provided psycho education of potential negative effect on communication if she were to be stressed and/or depressed.  She agrees to think about marriage for a while without pursuing immediate action.  She hopes to do slow up titration of Effexor.  She has occasional insomnia.  She feels depressed and sad.  She  has occasional difficulty in concentration.  She has fair appetite.  She denies SI.  She feels anxious sometimes.  She denies panic attacks.     Visit Diagnosis:    ICD-10-CM   1. MDD (major depressive disorder), recurrent episode, moderate (HCC) F33.1     Past Psychiatric History: Please see initial evaluation for full details. I have reviewed the history. No updates at this time.     Past Medical History:  Past Medical History:  Diagnosis Date  . ASCUS (atypical squamous cells of undetermined significance) on Pap smear   . CIN I (cervical intraepithelial neoplasia I) 11/11   colpo done  . PMS (premenstrual syndrome)     Past Surgical History:  Procedure Laterality Date  . COLPOSCOPY  11/11  . TUBAL LIGATION      Family Psychiatric History: Please see initial evaluation for full details. I have reviewed the history. No updates at this time.     Family History:  Family History  Problem Relation Age of Onset  . Hypertension Mother   . Huntington's disease Father   . Cancer Maternal Grandmother 80       colon  . Huntington's disease Paternal Aunt   . Huntington's disease Paternal Grandmother     Social History:  Social History   Socioeconomic History  . Marital status: Married    Spouse name: Not on file  . Number of children: Not on file  . Years of education:  Not on file  . Highest education level: Not on file  Occupational History  . Not on file  Social Needs  . Financial resource strain: Not on file  . Food insecurity:    Worry: Not on file    Inability: Not on file  . Transportation needs:    Medical: Not on file    Non-medical: Not on file  Tobacco Use  . Smoking status: Never Smoker  . Smokeless tobacco: Never Used  Substance and Sexual Activity  . Alcohol use: No  . Drug use: No  . Sexual activity: Yes    Birth control/protection: Pill    Comment: Ocella  Lifestyle  . Physical activity:    Days per week: Not on file    Minutes per  session: Not on file  . Stress: Not on file  Relationships  . Social connections:    Talks on phone: Not on file    Gets together: Not on file    Attends religious service: Not on file    Active member of club or organization: Not on file    Attends meetings of clubs or organizations: Not on file    Relationship status: Not on file  Other Topics Concern  . Not on file  Social History Narrative  . Not on file    Allergies:  Allergies  Allergen Reactions  . Penicillins     Metabolic Disorder Labs: No results found for: HGBA1C, MPG No results found for: PROLACTIN No results found for: CHOL, TRIG, HDL, CHOLHDL, VLDL, LDLCALC No results found for: TSH  Therapeutic Level Labs: No results found for: LITHIUM No results found for: VALPROATE No components found for:  CBMZ  Current Medications: Current Outpatient Medications  Medication Sig Dispense Refill  . Cholecalciferol (VITAMIN D PO) Take by mouth.    . drospirenone-ethinyl estradiol (YASMIN,ZARAH,SYEDA) 3-0.03 MG tablet Take 1 tablet by mouth daily. 1 Package 11  . fish oil-omega-3 fatty acids 1000 MG capsule Take 2 g by mouth daily.    . folic acid (FOLVITE) 0.5 MG tablet Take 1 tablet (1 mg total) by mouth daily. 30 tablet 11  . norethindrone-ethinyl estradiol (JUNEL FE,GILDESS FE,LOESTRIN FE) 1-20 MG-MCG tablet Take 1 tablet by mouth daily.    . traZODone (DESYREL) 50 MG tablet 25-50 mg at night as needed for sleep 90 tablet 0  . venlafaxine XR (EFFEXOR-XR) 37.5 MG 24 hr capsule Take total of 112.5 mg daily (37.5 mg +75 mg) 30 capsule 0  . venlafaxine XR (EFFEXOR-XR) 75 MG 24 hr capsule Take total of 112.5 mg daily (37.5 mg +75 mg) 90 capsule 0   No current facility-administered medications for this visit.      Musculoskeletal: Strength & Muscle Tone: N/A Gait & Station: N/A Patient leans: N/A  Psychiatric Specialty Exam: Review of Systems  Psychiatric/Behavioral: Positive for depression. Negative for  hallucinations, memory loss, substance abuse and suicidal ideas. The patient is nervous/anxious and has insomnia.   All other systems reviewed and are negative.   There were no vitals taken for this visit.There is no height or weight on file to calculate BMI.  General Appearance: NA  Eye Contact:  Good  Speech:  Clear and Coherent  Volume:  Normal  Mood:  Depressed  Affect:  Appropriate, Congruent, Restricted and Tearful  Thought Process:  Coherent  Orientation:  Full (Time, Place, and Person)  Thought Content: Logical   Suicidal Thoughts:  No  Homicidal Thoughts:  No  Memory:  Immediate;  Good  Judgement:  Good  Insight:  Fair  Psychomotor Activity:  Normal  Concentration:  Concentration: Good and Attention Span: Good  Recall:  Good  Fund of Knowledge: Good  Language: Good  Akathisia:  No  Handed:  Right  AIMS (if indicated): not done  Assets:  Communication Skills Desire for Improvement  ADL's:  Intact  Cognition: WNL  Sleep:  Poor   Screenings:   Assessment and Plan:  Marcia Terry is a 48 y.o. year old female with a history of depression, who presents for follow up appointment for MDD (major depressive disorder), recurrent episode, moderate (HCC)  # MDD, moderate, recurrent without psychotic features Patient reports worsening in depression in the context of separation.  Other psychosocial stressors includes her father being diagnosed with Huntington's disease.  Will do up titration of Effexor; she prefers to try slower up titration.  Discussed effective communication.  She will continue to see her therapist.   Plan I have reviewed and updated plans as below 1 .Increase Venlafaxine 112.5 mg daily 2. Return to clinic in May 1st at 10:30 for 30 mins, video visit - she is on Trazodone prn. She declined refill - Will ask more about her developmental history - Checked TSH; pending by her primary care by report Emergency resources which includes 911, ED, suicide  crisis line [phone]) are discussed.   The patient demonstrates the following risk factors for suicide: Chronic risk factors for suicide include:psychiatric disorder ofdepression. Acute risk factorsfor suicide include: N/A. Protective factorsfor this patient include: positive social support, responsibility to others (children, family), coping skills and hope for the future. Considering these factors, the overall suicide risk at this point appears to below. Patientisappropriate for outpatient follow up.  [name], MD 07/31/2018, 12:04 PM

## 2018-07-31 NOTE — Patient Instructions (Signed)
1 .Increase Venlafaxine 112.5 mg daily 2. Return to clinic in May 1st at 10:30 for 30 mins, video visit

## 2018-08-02 ENCOUNTER — Ambulatory Visit (INDEPENDENT_AMBULATORY_CARE_PROVIDER_SITE_OTHER): Payer: BLUE CROSS/BLUE SHIELD | Admitting: Psychology

## 2018-08-02 DIAGNOSIS — F411 Generalized anxiety disorder: Secondary | ICD-10-CM | POA: Diagnosis not present

## 2018-08-15 ENCOUNTER — Ambulatory Visit (HOSPITAL_COMMUNITY): Payer: BLUE CROSS/BLUE SHIELD | Admitting: Psychiatry

## 2018-08-25 ENCOUNTER — Telehealth (HOSPITAL_COMMUNITY): Payer: Self-pay | Admitting: Professional

## 2018-08-25 ENCOUNTER — Ambulatory Visit (INDEPENDENT_AMBULATORY_CARE_PROVIDER_SITE_OTHER): Payer: BLUE CROSS/BLUE SHIELD | Admitting: Psychiatry

## 2018-08-25 ENCOUNTER — Other Ambulatory Visit: Payer: Self-pay

## 2018-08-25 ENCOUNTER — Encounter (HOSPITAL_COMMUNITY): Payer: Self-pay | Admitting: Psychiatry

## 2018-08-25 DIAGNOSIS — F331 Major depressive disorder, recurrent, moderate: Secondary | ICD-10-CM | POA: Diagnosis not present

## 2018-08-25 MED ORDER — VENLAFAXINE HCL ER 37.5 MG PO CP24: ORAL_CAPSULE | ORAL | 0 refills | Status: DC

## 2018-08-25 MED ORDER — BUPROPION HCL ER (XL) 150 MG PO TB24: 150.0000 mg | ORAL_TABLET | Freq: Every day | ORAL | 1 refills | Status: DC
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
## 2018-08-25 NOTE — Patient Instructions (Signed)
1  Contiue Venlafaxine 112.5 mg daily 2. Start Bupropion 150 mg daily  3. Next appointment: 6/4 at 9 AM for 30 mins

## 2018-08-28 ENCOUNTER — Telehealth (HOSPITAL_COMMUNITY): Payer: Self-pay | Admitting: Professional

## 2018-08-30 ENCOUNTER — Telehealth (HOSPITAL_COMMUNITY): Payer: Self-pay | Admitting: Professional

## 2018-09-04 ENCOUNTER — Ambulatory Visit (INDEPENDENT_AMBULATORY_CARE_PROVIDER_SITE_OTHER): Payer: BLUE CROSS/BLUE SHIELD | Admitting: Psychology

## 2018-09-04 DIAGNOSIS — F411 Generalized anxiety disorder: Secondary | ICD-10-CM | POA: Diagnosis not present

## 2018-09-20 ENCOUNTER — Ambulatory Visit (INDEPENDENT_AMBULATORY_CARE_PROVIDER_SITE_OTHER): Payer: BLUE CROSS/BLUE SHIELD | Admitting: Psychology

## 2018-09-20 DIAGNOSIS — F411 Generalized anxiety disorder: Secondary | ICD-10-CM | POA: Diagnosis not present

## 2018-09-20 NOTE — Progress Notes (Deleted)
[facility] MD/PA/NP OP Progress Note  09/20/2018 12:57 PM Marcia Terry  MRN:  [id number]  Chief Complaint:  HPI: *** Visit Diagnosis: No diagnosis found.  Past Psychiatric History: Please see initial evaluation for full details. I have reviewed the history. No updates at this time.     Past Medical History:  Past Medical History:  Diagnosis Date  . ASCUS (atypical squamous cells of undetermined significance) on Pap smear   . CIN I (cervical intraepithelial neoplasia I) 11/11   colpo done  . PMS (premenstrual syndrome)     Past Surgical History:  Procedure Laterality Date  . COLPOSCOPY  11/11  . TUBAL LIGATION      Family Psychiatric History: Please see initial evaluation for full details. I have reviewed the history. No updates at this time.     Family History:  Family History  Problem Relation Age of Onset  . Hypertension Mother   . Huntington's disease Father   . Cancer Maternal Grandmother 80       colon  . Huntington's disease Paternal Aunt   . Huntington's disease Paternal Grandmother     Social History:  Social History   Socioeconomic History  . Marital status: Married    Spouse name: Not on file  . Number of children: Not on file  . Years of education: Not on file  . Highest education level: Not on file  Occupational History  . Not on file  Social Needs  . Financial resource strain: Not on file  . Food insecurity:    Worry: Not on file    Inability: Not on file  . Transportation needs:    Medical: Not on file    Non-medical: Not on file  Tobacco Use  . Smoking status: Never Smoker  . Smokeless tobacco: Never Used  Substance and Sexual Activity  . Alcohol use: No  . Drug use: No  . Sexual activity: Yes    Birth control/protection: Pill    Comment: Ocella  Lifestyle  . Physical activity:    Days per week: Not on file    Minutes per session: Not on file  . Stress: Not on file  Relationships  . Social connections:    Talks on phone: Not on file     Gets together: Not on file    Attends religious service: Not on file    Active member of club or organization: Not on file    Attends meetings of clubs or organizations: Not on file    Relationship status: Not on file  Other Topics Concern  . Not on file  Social History Narrative  . Not on file    Allergies:  Allergies  Allergen Reactions  . Penicillins     Metabolic Disorder Labs: No results found for: HGBA1C, MPG No results found for: PROLACTIN No results found for: CHOL, TRIG, HDL, CHOLHDL, VLDL, LDLCALC No results found for: TSH  Therapeutic Level Labs: No results found for: LITHIUM No results found for: VALPROATE No components found for:  CBMZ  Current Medications: Current Outpatient Medications  Medication Sig Dispense Refill  . buPROPion (WELLBUTRIN XL) 150 MG 24 hr tablet Take 1 tablet (150 mg total) by mouth daily. 30 tablet 1  . Cholecalciferol (VITAMIN D PO) Take by mouth.    . drospirenone-ethinyl estradiol (YASMIN,ZARAH,SYEDA) 3-0.03 MG tablet Take 1 tablet by mouth daily. 1 Package 11  . fish oil-omega-3 fatty acids 1000 MG capsule Take 2 g by mouth daily.    . folic acid (  FOLVITE) 0.5 MG tablet Take 1 tablet (1 mg total) by mouth daily. 30 tablet 11  . norethindrone-ethinyl estradiol (JUNEL FE,GILDESS FE,LOESTRIN FE) 1-20 MG-MCG tablet Take 1 tablet by mouth daily.    . traZODone (DESYREL) 50 MG tablet 25-50 mg at night as needed for sleep 90 tablet 0  . venlafaxine XR (EFFEXOR-XR) 37.5 MG 24 hr capsule Take total of 112.5 mg daily (37.5 mg +75 mg) 90 capsule 0  . venlafaxine XR (EFFEXOR-XR) 75 MG 24 hr capsule Take total of 112.5 mg daily (37.5 mg +75 mg) 90 capsule 0   No current facility-administered medications for this visit.      Musculoskeletal: Strength & Muscle Tone: N/A Gait & Station: N/A Patient leans: N/A  Psychiatric Specialty Exam: ROS  There were no vitals taken for this visit.There is no height or weight on file to calculate BMI.   General Appearance: {Appearance:22683}  Eye Contact:  {B[facility] EYE CONTACT:22684}  Speech:  Clear and Coherent  Volume:  Normal  Mood:  {B[facility] MOOD:22306}  Affect:  {Affect (PAA):22687}  Thought Process:  Coherent  Orientation:  Full (Time, Place, and Person)  Thought Content: Logical   Suicidal Thoughts:  {ST/HT (PAA):22692}  Homicidal Thoughts:  {ST/HT (PAA):22692}  Memory:  Immediate;   Good  Judgement:  {Judgement (PAA):22694}  Insight:  {Insight (PAA):22695}  Psychomotor Activity:  Normal  Concentration:  Concentration: Good and Attention Span: Good  Recall:  Good  Fund of Knowledge: Good  Language: Good  Akathisia:  No  Handed:  Right  AIMS (if indicated): not done  Assets:  Communication Skills Desire for Improvement  ADL's:  Intact  Cognition: WNL  Sleep:  {B[facility] GOOD/FAIR/POOR:22877}   Screenings:   Assessment and Plan:  Marcia Terry is a 48 y.o. year old female with a history of depression,  who presents for follow up appointment for No diagnosis found.  # MDD, moderate, recurrent without psychotic features  Patient continues to report present symptoms in the context of separation.  Other psychosocial stressors includes her father being diagnosed with Huntington's disease.  Although she did report some benefit from venlafaxine, she has drowsiness, which she attributes to venlafaxine.  After having discussed option of switching to other antidepressant, will first try adjunctive treatment depression.  Will start bupropion to target depression.  Discussed potential side effect of worsening in anxiety and headache.  She has no known history of seizure.  Will continue venlafaxine to target depression. Validated her sadness. She will [facility] from [facility]; will make a referral  Plan  1  ContinueVenlafaxine 112.5 mg daily(some drowsiness after uptitration) 2. Start Bupropion 150 mg daily  3. Next appointment: 6/4 at 9 AM for 30 mins - she is on Trazodone prn. She  declined refill - she sees a therapist - Checked TSH; pending by her primary care by report  Past trials of medication:sertraline, fluoxetine ("could not function")  The patient demonstrates the following risk factors for suicide: Chronic risk factors for suicide include:psychiatric disorder ofdepression. Acute risk factorsfor suicide include: N/A. Protective factorsfor this patient include: positive social support, responsibility to others (children, family), coping skills and hope for the future. Considering these factors, the overall suicide risk  [name], MD 09/20/2018, 12:57 PM

## 2018-09-28 ENCOUNTER — Ambulatory Visit (HOSPITAL_COMMUNITY): Payer: BLUE CROSS/BLUE SHIELD | Admitting: Psychiatry

## 2018-10-04 NOTE — Progress Notes (Signed)
Virtual Visit via Video Note  I connected with Marcia Terry on 10/10/18 at  4:30 PM EDT by a video enabled telemedicine application and verified that I am speaking with the correct person using two identifiers.   I discussed the limitations of evaluation and management by telemedicine and the availability of in person appointments. The patient expressed understanding and agreed to proceed.  I discussed the assessment and treatment plan with the patient. The patient was provided an opportunity to ask questions and all were answered. The patient agreed with the plan and demonstrated an understanding of the instructions.   The patient was advised to call back or seek an in-person evaluation if the symptoms worsen or if the condition fails to improve as anticipated.  I provided 15 minutes of non-face-to-face time during this encounter.   Norman Clay, MD    [facility] MD/PA/NP OP Progress Note  10/10/2018 4:57 PM Marcia Terry  MRN:  [id number]  Chief Complaint:  Chief Complaint    Follow-up; Depression     HPI:  This is a follow-up appointment for depression.  She states that she has been having more good days than bad days.  She has been feeling much different and feels better.  She states that she has not taken bupropion as she was concerned that her daughter had more sedating effect from the medication.  She also decided not to attend PHP, stating that she feels better.  She believes that the "time" helps the patient to feel better. She believes that she is done with crying and moaning long time ago. She signed divorce paper yesterday. It was "not bothering" for her. She states that "he calmed down" and they tried to stay polite and "not to lose cool." She will be leaving for a short trip for a week to "hide," and is planning to visit her parents. She will also help her daughter, who will move to [facility]. She repeats that she feels "much better" and wants to stay on current medication.  She has  insomnia.  She feels less fatigue.  She feels less depressed. She denies SI. She feels less anxious. She denies panic attacks. She will continue to see her therapist every two week.    Visit Diagnosis:    ICD-10-CM   1. MDD (major depressive disorder), recurrent episode, moderate ([place])  F33.1     Past Psychiatric History: Please see initial evaluation for full details. I have reviewed the history. No updates at this time.     Past Medical History:  Past Medical History:  Diagnosis Date  . ASCUS (atypical squamous cells of undetermined significance) on Pap smear   . CIN I (cervical intraepithelial neoplasia I) 11/11   colpo done  . PMS (premenstrual syndrome)     Past Surgical History:  Procedure Laterality Date  . COLPOSCOPY  11/11  . TUBAL LIGATION      Family Psychiatric History: Please see initial evaluation for full details. I have reviewed the history. No updates at this time.     Family History:  Family History  Problem Relation Age of Onset  . Hypertension Mother   . Huntington's disease Father   . Cancer Maternal Grandmother 62       colon  . Huntington's disease Paternal Aunt   . Huntington's disease Paternal Grandmother     Social History:  Social History   Socioeconomic History  . Marital status: Married    Spouse name: Not on file  . Number of children: Not  on file  . Years of education: Not on file  . Highest education level: Not on file  Occupational History  . Not on file  Social Needs  . Financial resource strain: Not on file  . Food insecurity    Worry: Not on file    Inability: Not on file  . Transportation needs    Medical: Not on file    Non-medical: Not on file  Tobacco Use  . Smoking status: Never Smoker  . Smokeless tobacco: Never Used  Substance and Sexual Activity  . Alcohol use: No  . Drug use: No  . Sexual activity: Yes    Birth control/protection: Pill    Comment: Ocella  Lifestyle  . Physical activity    Days per  week: Not on file    Minutes per session: Not on file  . Stress: Not on file  Relationships  . Social Musicianconnections    Talks on phone: Not on file    Gets together: Not on file    Attends religious service: Not on file    Active member of club or organization: Not on file    Attends meetings of clubs or organizations: Not on file    Relationship status: Not on file  Other Topics Concern  . Not on file  Social History Narrative  . Not on file    Allergies:  Allergies  Allergen Reactions  . Penicillins     Metabolic Disorder Labs: No results found for: HGBA1C, MPG No results found for: PROLACTIN No results found for: CHOL, TRIG, HDL, CHOLHDL, VLDL, LDLCALC No results found for: TSH  Therapeutic Level Labs: No results found for: LITHIUM No results found for: VALPROATE No components found for:  CBMZ  Current Medications: Current Outpatient Medications  Medication Sig Dispense Refill  . buPROPion (WELLBUTRIN XL) 150 MG 24 hr tablet Take 1 tablet (150 mg total) by mouth daily. (Patient not taking: Reported on 10/10/2018) 30 tablet 1  . Cholecalciferol (VITAMIN D PO) Take by mouth.    . drospirenone-ethinyl estradiol (YASMIN,ZARAH,SYEDA) 3-0.03 MG tablet Take 1 tablet by mouth daily. 1 Package 11  . fish oil-omega-3 fatty acids 1000 MG capsule Take 2 g by mouth daily.    . folic acid (FOLVITE) 0.5 MG tablet Take 1 tablet (1 mg total) by mouth daily. 30 tablet 11  . norethindrone-ethinyl estradiol (JUNEL FE,GILDESS FE,LOESTRIN FE) 1-20 MG-MCG tablet Take 1 tablet by mouth daily.    . traZODone (DESYREL) 50 MG tablet 25-50 mg at night as needed for sleep 90 tablet 0  . venlafaxine XR (EFFEXOR-XR) 37.5 MG 24 hr capsule Take total of 112.5 mg daily (37.5 mg +75 mg) 90 capsule 0  . [START ON 10/30/2018] venlafaxine XR (EFFEXOR-XR) 75 MG 24 hr capsule Take total of 112.5 mg daily (37.5 mg +75 mg) 90 capsule 0   No current facility-administered medications for this visit.       Musculoskeletal: Strength & Muscle Tone: N/A Gait & Station: N/A Patient leans: N/A  Psychiatric Specialty Exam: ROS  There were no vitals taken for this visit.There is no height or weight on file to calculate BMI.  General Appearance: Fairly Groomed  Eye Contact:  Good  Speech:  Clear and Coherent  Volume:  Normal  Mood:  "better"  Affect:  Appropriate, Non-Congruent and Restricted  Thought Process:  Coherent  Orientation:  Full (Time, Place, and Person)  Thought Content: Logical   Suicidal Thoughts:  No  Homicidal Thoughts:  No  Memory:  Immediate;   Good  Judgement:  Good  Insight:  Present  Psychomotor Activity:  Normal  Concentration:  Concentration: Good and Attention Span: Good  Recall:  Good  Fund of Knowledge: Good  Language: Good  Akathisia:  No  Handed:  Right  AIMS (if indicated): not done  Assets:  Communication Skills Desire for Improvement  ADL's:  Intact  Cognition: WNL  Sleep:  Poor   Screenings:   Assessment and Plan:  Marcia Terry is a 48 y.o. year old female with a history of depression, who presents for follow up appointment for depression.   # MDD, moderate, recurrent without psychotic features Exam is notable for restricted affect, which is not congruent to her subjective improvement in depressive symptoms. Psychosocial stressors include divorce, and her father being diagnosed with Huntington's disease.  She has not started bupropion with concern of her daughter experiencing more fatigue from the medication.  Although she will likely benefit from adjunctive treatment, will continue the same regimen at this time and continue to work on developing rapport so that she will be more comfortable to medication change.  Discussed behavioral activation.  She will continue to see a therapist.   Plan I have reviewed and updated plans as below 1  ContinueVenlafaxine 112.5 mg daily(some drowsiness after uptitration) (She has not started  bupropion) 2. Next appointment: 8/11 at 10 AM for 20 mins, video - she is on Trazodone prn. She declined refill - she sees a therapist - Checked TSH; pending by her primary care by report  Past trials of medication:sertraline, fluoxetine ("could not function")  I have reviewed suicide assessment in detail. No change in the following assessment.   The patient demonstrates the following risk factors for suicide: Chronic risk factors for suicide include:psychiatric disorder ofdepression. Acute risk factorsfor suicide include: N/A. Protective factorsfor this patient include: positive social support, responsibility to others (children, family), coping skills and hope for the future. Considering these factors, the overall suicide risk at this point appears to below. Patientisappropriate for outpatient follow up.  Neysa Hottereina Kwan Shellhammer, MD 10/10/2018, 4:57 PM

## 2018-10-10 ENCOUNTER — Encounter (HOSPITAL_COMMUNITY): Payer: Self-pay | Admitting: Psychiatry

## 2018-10-10 ENCOUNTER — Other Ambulatory Visit: Payer: Self-pay

## 2018-10-10 ENCOUNTER — Ambulatory Visit (INDEPENDENT_AMBULATORY_CARE_PROVIDER_SITE_OTHER): Payer: BC Managed Care – PPO | Admitting: Psychiatry

## 2018-10-10 DIAGNOSIS — F331 Major depressive disorder, recurrent, moderate: Secondary | ICD-10-CM

## 2018-10-10 MED ORDER — VENLAFAXINE HCL ER 75 MG PO CP24: ORAL_CAPSULE | ORAL | 0 refills | Status: DC

## 2018-10-10 NOTE — Patient Instructions (Signed)
1  ContinueVenlafaxine 112.5 mg daily 2. Next appointment: 8/11 at 10 AM

## 2018-11-20 ENCOUNTER — Other Ambulatory Visit (HOSPITAL_COMMUNITY): Payer: Self-pay | Admitting: Psychiatry

## 2018-11-20 MED ORDER — VENLAFAXINE HCL ER 37.5 MG PO CP24: ORAL_CAPSULE | ORAL | 0 refills | Status: DC

## 2018-11-30 NOTE — Progress Notes (Deleted)
[facility] MD/PA/NP OP Progress Note  11/30/2018 2:55 PM Marcia SillCandice Moist  MRN:  [id number]  Chief Complaint:  HPI: *** Visit Diagnosis: No diagnosis found.  Past Psychiatric History: Please see initial evaluation for full details. I have reviewed the history. No updates at this time.     Past Medical History:  Past Medical History:  Diagnosis Date  . ASCUS (atypical squamous cells of undetermined significance) on Pap smear   . CIN I (cervical intraepithelial neoplasia I) 11/11   colpo done  . PMS (premenstrual syndrome)     Past Surgical History:  Procedure Laterality Date  . COLPOSCOPY  11/11  . TUBAL LIGATION      Family Psychiatric History: Please see initial evaluation for full details. I have reviewed the history. No updates at this time.     Family History:  Family History  Problem Relation Age of Onset  . Hypertension Mother   . Huntington's disease Father   . Cancer Maternal Grandmother 80       colon  . Huntington's disease Paternal Aunt   . Huntington's disease Paternal Grandmother     Social History:  Social History   Socioeconomic History  . Marital status: Married    Spouse name: Not on file  . Number of children: Not on file  . Years of education: Not on file  . Highest education level: Not on file  Occupational History  . Not on file  Social Needs  . Financial resource strain: Not on file  . Food insecurity    Worry: Not on file    Inability: Not on file  . Transportation needs    Medical: Not on file    Non-medical: Not on file  Tobacco Use  . Smoking status: Never Smoker  . Smokeless tobacco: Never Used  Substance and Sexual Activity  . Alcohol use: No  . Drug use: No  . Sexual activity: Yes    Birth control/protection: Pill    Comment: Ocella  Lifestyle  . Physical activity    Days per week: Not on file    Minutes per session: Not on file  . Stress: Not on file  Relationships  . Social Musicianconnections    Talks on phone: Not on file   Gets together: Not on file    Attends religious service: Not on file    Active member of club or organization: Not on file    Attends meetings of clubs or organizations: Not on file    Relationship status: Not on file  Other Topics Concern  . Not on file  Social History Narrative  . Not on file    Allergies:  Allergies  Allergen Reactions  . Penicillins     Metabolic Disorder Labs: No results found for: HGBA1C, MPG No results found for: PROLACTIN No results found for: CHOL, TRIG, HDL, CHOLHDL, VLDL, LDLCALC No results found for: TSH  Therapeutic Level Labs: No results found for: LITHIUM No results found for: VALPROATE No components found for:  CBMZ  Current Medications: Current Outpatient Medications  Medication Sig Dispense Refill  . buPROPion (WELLBUTRIN XL) 150 MG 24 hr tablet Take 1 tablet (150 mg total) by mouth daily. (Patient not taking: Reported on 10/10/2018) 30 tablet 1  . Cholecalciferol (VITAMIN D PO) Take by mouth.    . drospirenone-ethinyl estradiol (YASMIN,ZARAH,SYEDA) 3-0.03 MG tablet Take 1 tablet by mouth daily. 1 Package 11  . fish oil-omega-3 fatty acids 1000 MG capsule Take 2 g by mouth daily.    .Marland Kitchen  folic acid (FOLVITE) 0.5 MG tablet Take 1 tablet (1 mg total) by mouth daily. 30 tablet 11  . norethindrone-ethinyl estradiol (JUNEL FE,GILDESS FE,LOESTRIN FE) 1-20 MG-MCG tablet Take 1 tablet by mouth daily.    . traZODone (DESYREL) 50 MG tablet 25-50 mg at night as needed for sleep 90 tablet 0  . venlafaxine XR (EFFEXOR-XR) 37.5 MG 24 hr capsule Take total of 112.5 mg daily (37.5 mg +75 mg) 90 capsule 0  . venlafaxine XR (EFFEXOR-XR) 75 MG 24 hr capsule Take total of 112.5 mg daily (37.5 mg +75 mg) 90 capsule 0   No current facility-administered medications for this visit.      Musculoskeletal: Strength & Muscle Tone: N/A Gait & Station: N/A Patient leans: N/A  Psychiatric Specialty Exam: ROS  There were no vitals taken for this visit.There is no  height or weight on file to calculate BMI.  General Appearance: {Appearance:22683}  Eye Contact:  {B[facility] EYE CONTACT:22684}  Speech:  Clear and Coherent  Volume:  Normal  Mood:  {B[facility] MOOD:22306}  Affect:  {Affect (PAA):22687}  Thought Process:  Coherent  Orientation:  Full (Time, Place, and Person)  Thought Content: Logical   Suicidal Thoughts:  {ST/HT (PAA):22692}  Homicidal Thoughts:  {ST/HT (PAA):22692}  Memory:  Immediate;   Good  Judgement:  {Judgement (PAA):22694}  Insight:  {Insight (PAA):22695}  Psychomotor Activity:  Normal  Concentration:  Concentration: Good and Attention Span: Good  Recall:  Good  Fund of Knowledge: Good  Language: Good  Akathisia:  No  Handed:  Right  AIMS (if indicated): not done  Assets:  Communication Skills Desire for Improvement  ADL's:  Intact  Cognition: WNL  Sleep:  {B[facility] GOOD/FAIR/POOR:22877}   Screenings:   Assessment and Plan:  Marcia Terry is a 48 y.o. year old female with a history of depression, who presents for follow up appointment for No diagnosis found.  # MDD, moderate, recurrent without psychotic features  Exam is notable for restricted affect, which is not congruent to her subjective improvement in depressive symptoms. Psychosocial stressors include divorce, and her father being diagnosed with Huntington's disease.  She has not started bupropion with concern of her daughter experiencing more fatigue from the medication.  Although she will likely benefit from adjunctive treatment, will continue the same regimen at this time and continue to work on developing rapport so that she will be more comfortable to medication change.  Discussed behavioral activation.  She will continue to see a therapist.   Plan  1ContinueVenlafaxine 112.5 mg daily(some drowsiness after uptitration) (She has not started bupropion) 2. Next appointment: 8/11 at 10 AM for 20 mins, video - she is on Trazodone prn. She declined refill - she sees a  therapist - Checked TSH; pending by her primary care by report  Past trials of medication:sertraline, fluoxetine ("could not function")   The patient demonstrates the following risk factors for suicide: Chronic risk factors for suicide include:psychiatric disorder ofdepression. Acute risk factorsfor suicide include: N/A. Protective factorsfor this patient include: positive social support, responsibility to others (children, family), coping skills and hope for the future. Considering these factors, the overall suicide risk at this point appears to below. Patientisappropriate for outpatient follow up.  Norman Clay, MD 11/30/2018, 2:55 PM

## 2018-12-05 ENCOUNTER — Other Ambulatory Visit: Payer: Self-pay

## 2018-12-05 ENCOUNTER — Ambulatory Visit (HOSPITAL_COMMUNITY): Payer: BC Managed Care – PPO | Admitting: Psychiatry

## 2018-12-05 ENCOUNTER — Telehealth (HOSPITAL_COMMUNITY): Payer: Self-pay | Admitting: Psychiatry

## 2018-12-05 NOTE — Telephone Encounter (Signed)
Sent link for video visit through [employer] [facility]. Patient did not sign in. Called the patient  twice for appointment scheduled today. The patient did not answer the phone. Left voice message to contact the office.  

## 2018-12-06 NOTE — Progress Notes (Signed)
Virtual Visit via Video Note  I connected with Marcia Terry on 12/11/18 at  9:40 AM EDT by a video enabled telemedicine application and verified that I am speaking with the correct person using two identifiers.   I discussed the limitations of evaluation and management by telemedicine and the availability of in person appointments. The patient expressed understanding and agreed to proceed.     I discussed the assessment and treatment plan with the patient. The patient was provided an opportunity to ask questions and all were answered. The patient agreed with the plan and demonstrated an understanding of the instructions.   The patient was advised to call back or seek an in-person evaluation if the symptoms worsen or if the condition fails to improve as anticipated.  I provided 15 minutes of non-face-to-face time during this encounter.   Marcia Hottereina Rafay Dahan, MD    [facility] OP Progress Note  12/11/2018 10:05 AM Marcia SillCandice Fullenwider  MRN:  [id number]  Chief Complaint:  Chief Complaint    Depression; Follow-up     HPI:  This is a follow-up appointment for depression.  She apologized for missing the appointment as she misunderstood the scheduled time.  She states that she signed paperwork for divorce yesterday. She believes her mood is better as the situation is not as "stormy" as it used to be.  She believes both of them are in good place, although her husband is trying to catch up with things.  She hopes this to be peaceful for her children.  She continues to stay at home, and she has a legal right to do so as she owns the house. She is planning to stay with her parents in [place] and visit her daughter. She states that she has lived in [place] for 20 years. She has middle insomnia.  She feels less depressed.  She has fair concentration.  She has fair motivation.  She denies SI.  She feels less anxious.  She denies panic attacks except she has occasional intense anxiety with shortness of  breath.  She would like to reduce the dose of venlafaxine. Although it did help her and she thinks she needed it, she has drowsiness in the morning, which she attributes to venlafaxine.   Visit Diagnosis:    ICD-10-CM   1. Mild episode of recurrent major depressive disorder (HCC)  F33.0     Past Psychiatric History: Please see initial evaluation for full details. I have reviewed the history. No updates at this time.   Past Medical History:  Past Medical History:  Diagnosis Date  . ASCUS (atypical squamous cells of undetermined significance) on Pap smear   . CIN I (cervical intraepithelial neoplasia I) 11/11   colpo done  . PMS (premenstrual syndrome)     Past Surgical History:  Procedure Laterality Date  . COLPOSCOPY  11/11  . TUBAL LIGATION      Family Psychiatric History: Please see initial evaluation for full details. I have reviewed the history. No updates at this time.     Family History:  Family History  Problem Relation Age of Onset  . Hypertension Mother   . Huntington's disease Father   . Cancer Maternal Grandmother 80       colon  . Huntington's disease Paternal Aunt   . Huntington's disease Paternal Grandmother     Social History:  Social History   Socioeconomic History  . Marital status: Married    Spouse name: Not on file  . Number of children: Not on  file  . Years of education: Not on file  . Highest education level: Not on file  Occupational History  . Not on file  Social Needs  . Financial resource strain: Not on file  . Food insecurity    Worry: Not on file    Inability: Not on file  . Transportation needs    Medical: Not on file    Non-medical: Not on file  Tobacco Use  . Smoking status: Never Smoker  . Smokeless tobacco: Never Used  Substance and Sexual Activity  . Alcohol use: No  . Drug use: No  . Sexual activity: Yes    Birth control/protection: Pill    Comment: Ocella  Lifestyle  . Physical activity    Days per week: Not on  file    Minutes per session: Not on file  . Stress: Not on file  Relationships  . Social Herbalist on phone: Not on file    Gets together: Not on file    Attends religious service: Not on file    Active member of club or organization: Not on file    Attends meetings of clubs or organizations: Not on file    Relationship status: Not on file  Other Topics Concern  . Not on file  Social History Narrative  . Not on file    Allergies:  Allergies  Allergen Reactions  . Penicillins     Metabolic Disorder Labs: No results found for: HGBA1C, MPG No results found for: PROLACTIN No results found for: CHOL, TRIG, HDL, CHOLHDL, VLDL, LDLCALC No results found for: TSH  Therapeutic Level Labs: No results found for: LITHIUM No results found for: VALPROATE No components found for:  CBMZ  Current Medications: Current Outpatient Medications  Medication Sig Dispense Refill  . buPROPion (WELLBUTRIN XL) 150 MG 24 hr tablet Take 1 tablet (150 mg total) by mouth daily. (Patient not taking: Reported on 10/10/2018) 30 tablet 1  . Cholecalciferol (VITAMIN D PO) Take by mouth.    . drospirenone-ethinyl estradiol (YASMIN,ZARAH,SYEDA) 3-0.03 MG tablet Take 1 tablet by mouth daily. 1 Package 11  . fish oil-omega-3 fatty acids 1000 MG capsule Take 2 g by mouth daily.    . folic acid (FOLVITE) 0.5 MG tablet Take 1 tablet (1 mg total) by mouth daily. 30 tablet 11  . norethindrone-ethinyl estradiol (JUNEL FE,GILDESS FE,LOESTRIN FE) 1-20 MG-MCG tablet Take 1 tablet by mouth daily.    . traZODone (DESYREL) 50 MG tablet 25-50 mg at night as needed for sleep 90 tablet 0  . venlafaxine XR (EFFEXOR-XR) 37.5 MG 24 hr capsule Take total of 112.5 mg daily (37.5 mg +75 mg) 90 capsule 0  . venlafaxine XR (EFFEXOR-XR) 75 MG 24 hr capsule Take total of 112.5 mg daily (37.5 mg +75 mg) 90 capsule 0   No current facility-administered medications for this visit.      Musculoskeletal: Strength & Muscle  Tone: N/A Gait & Station: N/A Patient leans: N/A  Psychiatric Specialty Exam: Review of Systems  Psychiatric/Behavioral: Positive for depression. Negative for hallucinations, memory loss, substance abuse and suicidal ideas. The patient is nervous/anxious and has insomnia.   All other systems reviewed and are negative.   There were no vitals taken for this visit.There is no height or weight on file to calculate BMI.  General Appearance: Fairly Groomed  Eye Contact:  Good  Speech:  Clear and Coherent  Volume:  Normal  Mood:  "better"  Affect:  Appropriate, Congruent and down,  slightly tense  Thought Process:  Coherent  Orientation:  Full (Time, Place, and Person)  Thought Content: Logical   Suicidal Thoughts:  No  Homicidal Thoughts:  No  Memory:  Immediate;   Good  Judgement:  Good  Insight:  Fair  Psychomotor Activity:  Normal  Concentration:  Concentration: Good and Attention Span: Good  Recall:  Good  Fund of Knowledge: Good  Language: Good  Akathisia:  No  Handed:  Right  AIMS (if indicated): not done  Assets:  Communication Skills Desire for Improvement  ADL's:  Intact  Cognition: WNL  Sleep:  Poor   Screenings:   Assessment and Plan:  Marcia SillCandice Agar is a 48 y.o. year old female with a history of depression , who presents for follow up appointment for depression.   # MDD, mild, recurrent without psychotic features She reports overall improvement in depressive symptoms, although she somehow appears minimizing her feelings about divorce.  Psychosocial stressors include his divorce, and her father being diagnosed with Huntington's disease.  She has strong preference to taper down venlafaxine given some drowsiness in the morning.  Discussed with the patient about possible risk of relapse in her mood symptoms.  She is amenable to adjust medication as needed if it were to occur in the future.  She is encouraged to continue to see a therapist.   Plan I have reviewed and  updated plans as below 1DecreaseVenlafaxine 75 mg daily(some drowsiness at 112.5 mg) 2. Next appointment:  9/24 at 2 PM for 30 mins, video - she is on Trazodone prn. She declined refill - she sees a therapist - Checked TSH; pending by her primary care by report  Past trials of medication:sertraline, fluoxetine ("could not function")  I have reviewed suicide assessment in detail. No change in the following assessment.   The patient demonstrates the following risk factors for suicide: Chronic risk factors for suicide include:psychiatric disorder ofdepression. Acute risk factorsfor suicide include: N/A. Protective factorsfor this patient include: positive social support, responsibility to others (children, family), coping skills and hope for the future. Considering these factors, the overall suicide risk at this point appears to below. Patientisappropriate for outpatient follow up.  Marcia Hottereina Opie Fanton, MD 12/11/2018, 10:05 AM

## 2018-12-11 ENCOUNTER — Other Ambulatory Visit: Payer: Self-pay

## 2018-12-11 ENCOUNTER — Encounter (HOSPITAL_COMMUNITY): Payer: Self-pay | Admitting: Psychiatry

## 2018-12-11 ENCOUNTER — Ambulatory Visit (INDEPENDENT_AMBULATORY_CARE_PROVIDER_SITE_OTHER): Payer: BC Managed Care – PPO | Admitting: Psychiatry

## 2018-12-11 DIAGNOSIS — F330 Major depressive disorder, recurrent, mild: Secondary | ICD-10-CM

## 2018-12-11 NOTE — Patient Instructions (Signed)
1DecreaseVenlafaxine 75 mg daily 2. Next appointment:  9/24 at 2 PM

## 2019-02-05 ENCOUNTER — Other Ambulatory Visit (HOSPITAL_COMMUNITY): Payer: Self-pay | Admitting: Psychiatry

## 2019-02-05 MED ORDER — VENLAFAXINE HCL ER 75 MG PO CP24: 75.0000 mg | ORAL_CAPSULE | Freq: Every day | ORAL | 0 refills | Status: DC

## 2019-04-30 ENCOUNTER — Other Ambulatory Visit (HOSPITAL_COMMUNITY): Payer: Self-pay | Admitting: Psychiatry

## 2019-04-30 MED ORDER — VENLAFAXINE HCL ER 75 MG PO CP24: 75.0000 mg | ORAL_CAPSULE | Freq: Every day | ORAL | 0 refills | Status: DC

## 2019-07-20 ENCOUNTER — Other Ambulatory Visit (HOSPITAL_COMMUNITY): Payer: Self-pay | Admitting: Psychiatry

## 2019-07-20 NOTE — Telephone Encounter (Signed)
LVM TO CALL BACK TO SCHEDULE APPT PER PROVIDER DR ROSS:: Call for appt with dr. Vanetta Shawl

## 2019-07-20 NOTE — Telephone Encounter (Signed)
Call for appt with dr. Vanetta Shawl
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
## 2019-07-24 ENCOUNTER — Other Ambulatory Visit: Payer: Self-pay

## 2019-07-24 ENCOUNTER — Ambulatory Visit (INDEPENDENT_AMBULATORY_CARE_PROVIDER_SITE_OTHER): Payer: BC Managed Care – PPO | Admitting: Psychiatry

## 2019-07-24 ENCOUNTER — Encounter (HOSPITAL_COMMUNITY): Payer: Self-pay | Admitting: Psychiatry

## 2019-07-24 DIAGNOSIS — F330 Major depressive disorder, recurrent, mild: Secondary | ICD-10-CM

## 2019-07-24 MED ORDER — MELATONIN 3 MG PO TABS
9.00 | ORAL_TABLET | ORAL | Status: DC
Start: 2019-07-23 — End: 2019-07-24

## 2019-07-24 MED ORDER — GENERIC EXTERNAL MEDICATION
Status: DC
Start: ? — End: 2019-07-24

## 2019-07-24 MED ORDER — DEXTROSE 50 % IV SOLN
25.00 | INTRAVENOUS | Status: DC
Start: ? — End: 2019-07-24

## 2019-07-24 MED ORDER — GABAPENTIN 300 MG PO CAPS
600.00 | ORAL_CAPSULE | ORAL | Status: DC
Start: 2019-07-23 — End: 2019-07-24

## 2019-07-24 MED ORDER — GENERIC EXTERNAL MEDICATION
1.00 | Status: DC
Start: ? — End: 2019-07-24

## 2019-07-24 MED ORDER — BUTALBITAL-APAP-CAFFEINE 50-325-40 MG PO TABS
1.00 | ORAL_TABLET | ORAL | Status: DC
Start: ? — End: 2019-07-24

## 2019-07-24 MED ORDER — ACETAMINOPHEN 325 MG PO TABS
650.00 | ORAL_TABLET | ORAL | Status: DC
Start: 2019-07-23 — End: 2019-07-24

## 2019-07-24 MED ORDER — GLUCOSE 40 % PO GEL
1.00 | ORAL | Status: DC
Start: ? — End: 2019-07-24

## 2019-07-24 MED ORDER — METHOCARBAMOL 500 MG PO TABS
500.00 | ORAL_TABLET | ORAL | Status: DC
Start: 2019-07-23 — End: 2019-07-24

## 2019-07-24 MED ORDER — INSULIN ASPART 100 UNIT/ML FLEXPEN
PEN_INJECTOR | SUBCUTANEOUS | Status: DC
Start: 2019-07-23 — End: 2019-07-24

## 2019-07-24 MED ORDER — ENOXAPARIN SODIUM 30 MG/0.3ML ~~LOC~~ SOLN
0.50 | SUBCUTANEOUS | Status: DC
Start: 2019-07-23 — End: 2019-07-24

## 2019-07-24 MED ORDER — ELAGOLIX-ESTRAD-NORETH & ELAGO 300-1-0.5 & 300 MG PO CPPK
1.00 | ORAL_CAPSULE | ORAL | Status: DC
Start: 2019-07-23 — End: 2019-07-24

## 2019-07-24 MED ORDER — GENERIC EXTERNAL MEDICATION
2.00 | Status: DC
Start: 2019-07-23 — End: 2019-07-24

## 2019-07-24 MED ORDER — SENNOSIDES-DOCUSATE SODIUM 8.6-50 MG PO TABS
1.00 | ORAL_TABLET | ORAL | Status: DC
Start: 2019-07-23 — End: 2019-07-24

## 2019-07-24 MED ORDER — VENLAFAXINE HCL 37.5 MG PO TABS
75.00 | ORAL_TABLET | ORAL | Status: DC
Start: 2019-07-24 — End: 2019-07-24

## 2019-07-24 MED ORDER — GLUCAGON (RDNA) 1 MG IJ KIT
1.00 | PACK | INTRAMUSCULAR | Status: DC
Start: ? — End: 2019-07-24

## 2019-07-24 NOTE — Patient Instructions (Signed)
1ContinueVenlafaxine75 mg daily 2. Next appointment: 6/15 at 1:20
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
## 2019-10-09 ENCOUNTER — Encounter (HOSPITAL_COMMUNITY): Payer: Self-pay | Admitting: Psychiatry

## 2019-10-09 ENCOUNTER — Telehealth (INDEPENDENT_AMBULATORY_CARE_PROVIDER_SITE_OTHER): Payer: BC Managed Care – PPO | Admitting: Psychiatry

## 2019-10-09 ENCOUNTER — Other Ambulatory Visit: Payer: Self-pay

## 2019-10-09 DIAGNOSIS — F331 Major depressive disorder, recurrent, moderate: Secondary | ICD-10-CM

## 2019-10-09 MED ORDER — BUPROPION HCL ER (XL) 150 MG PO TB24: 150.0000 mg | ORAL_TABLET | Freq: Every day | ORAL | 1 refills | Status: DC

## 2019-10-13 ENCOUNTER — Other Ambulatory Visit (HOSPITAL_COMMUNITY): Payer: Self-pay | Admitting: Psychiatry

## 2019-11-07 ENCOUNTER — Other Ambulatory Visit (HOSPITAL_COMMUNITY): Payer: Self-pay | Admitting: Psychiatry

## 2019-11-07 MED ORDER — BUPROPION HCL ER (XL) 150 MG PO TB24: 150.0000 mg | ORAL_TABLET | Freq: Every day | ORAL | 1 refills | Status: DC
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
## 2019-11-13 ENCOUNTER — Telehealth (INDEPENDENT_AMBULATORY_CARE_PROVIDER_SITE_OTHER): Payer: BC Managed Care – PPO | Admitting: Psychiatry

## 2019-11-13 ENCOUNTER — Encounter (HOSPITAL_COMMUNITY): Payer: Self-pay | Admitting: Psychiatry

## 2019-11-13 ENCOUNTER — Other Ambulatory Visit: Payer: Self-pay

## 2019-11-13 DIAGNOSIS — F331 Major depressive disorder, recurrent, moderate: Secondary | ICD-10-CM

## 2019-11-13 MED ORDER — VENLAFAXINE HCL ER 75 MG PO CP24: 75.0000 mg | ORAL_CAPSULE | Freq: Every day | ORAL | 0 refills | Status: DC

## 2019-11-13 NOTE — Patient Instructions (Signed)
1Continuevenlafaxine75 mg daily 2. Start bupropion 150 mg daily  3. Next appointment: 9/7 at 3 PM
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
## 2020-01-01 ENCOUNTER — Encounter (HOSPITAL_COMMUNITY): Payer: Self-pay | Admitting: Psychiatry

## 2020-01-01 ENCOUNTER — Other Ambulatory Visit: Payer: Self-pay

## 2020-01-01 ENCOUNTER — Telehealth (INDEPENDENT_AMBULATORY_CARE_PROVIDER_SITE_OTHER): Payer: BC Managed Care – PPO | Admitting: Psychiatry

## 2020-01-01 DIAGNOSIS — F330 Major depressive disorder, recurrent, mild: Secondary | ICD-10-CM

## 2020-01-01 MED ORDER — VENLAFAXINE HCL ER 75 MG PO CP24: 75.0000 mg | ORAL_CAPSULE | Freq: Every day | ORAL | 0 refills | Status: DC

## 2020-01-01 NOTE — Patient Instructions (Signed)
1Continuevenlafaxine75 mg daily 2. Continue bupropion 150 mg daily  3. Next appointment: 11/9 at 11:30
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
## 2020-03-04 ENCOUNTER — Telehealth (HOSPITAL_COMMUNITY): Payer: BC Managed Care – PPO | Admitting: Psychiatry

## 2020-03-04 ENCOUNTER — Encounter: Payer: Self-pay | Admitting: Psychiatry

## 2020-03-04 ENCOUNTER — Other Ambulatory Visit: Payer: Self-pay

## 2020-03-04 ENCOUNTER — Telehealth (INDEPENDENT_AMBULATORY_CARE_PROVIDER_SITE_OTHER): Payer: BC Managed Care – PPO | Admitting: Psychiatry

## 2020-03-04 DIAGNOSIS — F3341 Major depressive disorder, recurrent, in partial remission: Secondary | ICD-10-CM | POA: Diagnosis not present
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
## 2020-04-29 ENCOUNTER — Other Ambulatory Visit: Payer: Self-pay

## 2020-04-29 ENCOUNTER — Encounter: Payer: Self-pay | Admitting: Psychiatry

## 2020-04-29 ENCOUNTER — Telehealth (INDEPENDENT_AMBULATORY_CARE_PROVIDER_SITE_OTHER): Payer: BC Managed Care – PPO | Admitting: Psychiatry

## 2020-04-29 DIAGNOSIS — F3341 Major depressive disorder, recurrent, in partial remission: Secondary | ICD-10-CM | POA: Diagnosis not present

## 2020-04-29 MED ORDER — BUPROPION HCL ER (XL) 150 MG PO TB24: 150.0000 mg | ORAL_TABLET | Freq: Every day | ORAL | 0 refills | Status: DC

## 2020-04-29 MED ORDER — VENLAFAXINE HCL ER 37.5 MG PO CP24: 37.5000 mg | ORAL_CAPSULE | Freq: Every day | ORAL | 1 refills | Status: DC

## 2020-04-29 NOTE — Patient Instructions (Signed)
1Decreasevenlafaxine37.5 mg daily 2.Continuebupropion 150 mg daily  3. Next appointment:2/15 at 11 AM

## 2020-05-14 ENCOUNTER — Telehealth: Payer: Self-pay

## 2020-05-14 NOTE — Telephone Encounter (Signed)
refill requested for a 90 day supply for venlafaxine hcl

## 2020-05-14 NOTE — Telephone Encounter (Signed)
Decline as we may change the dose.
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
## 2020-06-10 ENCOUNTER — Other Ambulatory Visit: Payer: Self-pay

## 2020-06-10 ENCOUNTER — Telehealth (INDEPENDENT_AMBULATORY_CARE_PROVIDER_SITE_OTHER): Payer: BC Managed Care – PPO | Admitting: Psychiatry

## 2020-06-10 ENCOUNTER — Encounter: Payer: Self-pay | Admitting: Psychiatry

## 2020-06-10 DIAGNOSIS — F3341 Major depressive disorder, recurrent, in partial remission: Secondary | ICD-10-CM | POA: Diagnosis not present

## 2020-06-10 MED ORDER — VENLAFAXINE HCL ER 37.5 MG PO CP24: 37.5000 mg | ORAL_CAPSULE | Freq: Every day | ORAL | 1 refills | Status: DC

## 2020-06-10 NOTE — Patient Instructions (Signed)
1Continue venlafaxine37.5 mg daily  2.Continuebupropion 150 mg daily  3. Next appointment:4/5 at 11 AM
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
## 2020-07-29 ENCOUNTER — Other Ambulatory Visit: Payer: Self-pay

## 2020-07-29 ENCOUNTER — Encounter: Payer: Self-pay | Admitting: Psychiatry

## 2020-07-29 ENCOUNTER — Telehealth (INDEPENDENT_AMBULATORY_CARE_PROVIDER_SITE_OTHER): Payer: BC Managed Care – PPO | Admitting: Psychiatry

## 2020-07-29 DIAGNOSIS — F3341 Major depressive disorder, recurrent, in partial remission: Secondary | ICD-10-CM | POA: Diagnosis not present

## 2020-07-29 NOTE — Patient Instructions (Signed)
1Continue venlafaxine37.5 mg daily 2.Continuebupropion 150 mg daily  3. Next appointment:6/30 at 1:20
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
## 2020-10-23 ENCOUNTER — Other Ambulatory Visit: Payer: Self-pay

## 2020-10-23 ENCOUNTER — Encounter: Payer: Self-pay | Admitting: Psychiatry

## 2020-10-23 ENCOUNTER — Telehealth (INDEPENDENT_AMBULATORY_CARE_PROVIDER_SITE_OTHER): Payer: BC Managed Care – PPO | Admitting: Psychiatry

## 2020-10-23 DIAGNOSIS — F3341 Major depressive disorder, recurrent, in partial remission: Secondary | ICD-10-CM | POA: Diagnosis not present

## 2020-10-23 MED ORDER — BUPROPION HCL ER (XL) 150 MG PO TB24: 150.0000 mg | ORAL_TABLET | Freq: Every day | ORAL | 1 refills | Status: DC

## 2020-10-23 MED ORDER — VENLAFAXINE HCL ER 37.5 MG PO CP24: 37.5000 mg | ORAL_CAPSULE | Freq: Every day | ORAL | 0 refills | Status: DC
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
## 2021-01-26 ENCOUNTER — Telehealth: Payer: BC Managed Care – PPO | Admitting: Psychiatry
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
## 2021-02-19 ENCOUNTER — Telehealth (INDEPENDENT_AMBULATORY_CARE_PROVIDER_SITE_OTHER): Payer: BC Managed Care – PPO | Admitting: Psychiatry

## 2021-02-19 ENCOUNTER — Other Ambulatory Visit: Payer: Self-pay

## 2021-02-19 ENCOUNTER — Encounter: Payer: Self-pay | Admitting: Psychiatry

## 2021-02-19 DIAGNOSIS — F3341 Major depressive disorder, recurrent, in partial remission: Secondary | ICD-10-CM | POA: Diagnosis not present

## 2021-02-19 MED ORDER — VENLAFAXINE HCL ER 37.5 MG PO CP24: 37.5000 mg | ORAL_CAPSULE | Freq: Every day | ORAL | 0 refills | Status: DC

## 2021-02-19 MED ORDER — BUPROPION HCL ER (XL) 150 MG PO TB24: 150.0000 mg | ORAL_TABLET | Freq: Every day | ORAL | 0 refills | Status: DC

## 2021-02-19 NOTE — Patient Instructions (Signed)
1  Continue  venlafaxine 37.5 mg daily  2. Continue bupropion 150 mg daily 3. Next appointment: 1/26 at 1:40

## 2021-05-19 NOTE — Progress Notes (Signed)
Virtual Visit via Video Note  I connected with Marcia Terry on 05/21/21 at  1:40 PM EST by a video enabled telemedicine application and verified that I am speaking with the correct person using two identifiers.  Location: Patient: home Provider: office Persons participated in the visit- patient, provider    I discussed the limitations of evaluation and management by telemedicine and the availability of in person appointments. The patient expressed understanding and agreed to proceed.    I discussed the assessment and treatment plan with the patient. The patient was provided an opportunity to ask questions and all were answered. The patient agreed with the plan and demonstrated an understanding of the instructions.   The patient was advised to call back or seek an in-person evaluation if the symptoms worsen or if the condition fails to improve as anticipated.  I provided 10 minutes of non-face-to-face time during this encounter.   [name], MD    [facility] MD/PA/NP OP Progress Note  05/21/2021 2:01 PM Marcia Terry  MRN:  [id number]  Chief Complaint:  Chief Complaint   Follow-up; Depression    HPI:  This is a follow-up appointment for depression.  She states that she was having viral illness during holiday season.  She had a good holiday with her children.  They were able to visit her mother-in-law.  She also had a good birthday, visiting her husband's friends in [place].  She states that she and her husband is getting back together, and reports good relationship.  She reports occasionally feeling depressed, anxious with low energy.  She also struggles with concentration.  She denies change in appetite.  She denies SI.  She feels comfortable to stay on the medication as it is at this time.   Employment: unemployed Household: ex-husband Marital status: separated in 2020 after 25 years of marriage Number of children:2 (4 grandchildren, oldest is 4 in 2022)   Visit Diagnosis:     ICD-10-CM   1. MDD (major depressive disorder), recurrent, in partial remission (HCC)  F33.41       Past Psychiatric History: Please see initial evaluation for full details. I have reviewed the history. No updates at this time.     Past Medical History:  Past Medical History:  Diagnosis Date   ASCUS (atypical squamous cells of undetermined significance) on Pap smear    CIN I (cervical intraepithelial neoplasia I) 11/11   colpo done   PMS (premenstrual syndrome)     Past Surgical History:  Procedure Laterality Date   COLPOSCOPY  11/11   TUBAL LIGATION      Family Psychiatric History: Please see initial evaluation for full details. I have reviewed the history. No updates at this time.     Family History:  Family History  Problem Relation Age of Onset   Hypertension Mother    Huntington's disease Father    Cancer Maternal Grandmother 44       colon   Huntington's disease Paternal Aunt    Huntington's disease Paternal Grandmother     Social History:  Social History   Socioeconomic History   Marital status: Married    Spouse name: Not on file   Number of children: Not on file   Years of education: Not on file   Highest education level: Not on file  Occupational History   Not on file  Tobacco Use   Smoking status: Never   Smokeless tobacco: Never  Substance and Sexual Activity   Alcohol use: No   Drug  use: No   Sexual activity: Yes    Birth control/protection: Pill    Comment: Ocella  Other Topics Concern   Not on file  Social History Narrative   Not on file   Social Determinants of Health   Financial Resource Strain: Not on file  Food Insecurity: Not on file  Transportation Needs: Not on file  Physical Activity: Not on file  Stress: Not on file  Social Connections: Not on file    Allergies:  Allergies  Allergen Reactions   Penicillins     Metabolic Disorder Labs: No results found for: HGBA1C, MPG No results found for: PROLACTIN No results  found for: CHOL, TRIG, HDL, CHOLHDL, VLDL, LDLCALC No results found for: TSH  Therapeutic Level Labs: No results found for: LITHIUM No results found for: VALPROATE No components found for:  CBMZ  Current Medications: Current Outpatient Medications  Medication Sig Dispense Refill   [START ON 07/22/2021] buPROPion (WELLBUTRIN XL) 150 MG 24 hr tablet Take 1 tablet (150 mg total) by mouth daily. 90 tablet 0   Cholecalciferol (VITAMIN D PO) Take by mouth.     drospirenone-ethinyl estradiol (YASMIN,ZARAH,SYEDA) 3-0.03 MG tablet Take 1 tablet by mouth daily. 1 Package 11   fish oil-omega-3 fatty acids 1000 MG capsule Take 2 g by mouth daily.     folic acid (FOLVITE) 0.5 MG tablet Take 1 tablet (1 mg total) by mouth daily. 30 tablet 11   norethindrone-ethinyl estradiol (JUNEL FE,GILDESS FE,LOESTRIN FE) 1-20 MG-MCG tablet Take 1 tablet by mouth daily.     [START ON 06/28/2021] venlafaxine XR (EFFEXOR-XR) 37.5 MG 24 hr capsule Take 1 capsule (37.5 mg total) by mouth daily with breakfast. 90 capsule 1   No current facility-administered medications for this visit.     Musculoskeletal: Strength & Muscle Tone:  N/A Gait & Station:  N/A Patient leans: N/A  Psychiatric Specialty Exam: Review of Systems  Psychiatric/Behavioral:  Positive for decreased concentration and dysphoric Terry. Negative for agitation, behavioral problems, confusion, hallucinations, self-injury, sleep disturbance and suicidal ideas. The patient is nervous/anxious. The patient is not hyperactive.   All other systems reviewed and are negative.  There were no vitals taken for this visit.There is no height or weight on file to calculate BMI.  General Appearance: Fairly Groomed  Eye Contact:  Good  Speech:  Clear and Coherent  Volume:  Normal  Terry:   good  Affect:  Appropriate, Congruent, and euthymic  Thought Process:  Coherent  Orientation:  Full (Time, Place, and Person)  Thought Content: Logical   Suicidal Thoughts:  No   Homicidal Thoughts:  No  Memory:  Immediate;   Good  Judgement:  Good  Insight:  Good  Psychomotor Activity:  Normal  Concentration:  Concentration: Good and Attention Span: Good  Recall:  Good  Fund of Knowledge: Good  Language: Good  Akathisia:  No  Handed:  Right  AIMS (if indicated): not done  Assets:  Communication Skills Desire for Improvement  ADL's:  Intact  Cognition: WNL  Sleep:  Fair   Screenings: PHQ2-9    Flowsheet Row Video Visit from 10/23/2020 in [facility] Video Visit from 07/29/2020 in [facility] Video Visit from 06/10/2020 in [facility]  PHQ-2 Total Score 1 2 2   PHQ-9 Total Score -- 4 10      Flowsheet Row Video Visit from 10/23/2020 in [facility] Video Visit from 07/29/2020 in [facility] Video Visit from  06/10/2020 [facility]  C-SSRS RISK CATEGORY No Risk No Risk No Risk        Assessment and Plan:  Marcia SillCandice Bielinski is a 51 y.o. year old female with a history of depression , who presents for follow up appointment for below.   1. MDD (major depressive disorder), recurrent, in partial remission (HCC) Although she reports occasional anxiety and depressed Terry, it has been self-limited.  Psychosocial stressors includes [facility], and her father, who has Huntington's disease.  Will continue current dose of venlafaxine and bupropion as maintenance treatment for depression/anxiety.   This clinician has discussed the side effect associated with medication prescribed during this encounter. Please refer to notes in the previous encounters for more details.    Plan   Continue  venlafaxine 37.5 mg daily (drowsiness at 112.5 mg) Continue bupropion 150 mg daily Next appointment: 5/4 at 1 PM for 20 mins, video - She will have PCP visit next month, she was advised again to check TSH   Past trials of  medication: sertraline, fluoxetine ("could not function")      The patient demonstrates the following risk factors for suicide: Chronic risk factors for suicide include: psychiatric disorder of depression. Acute risk factors for suicide include: N/A. Protective factors for this patient include: positive social support, responsibility to others (children, family), coping skills and hope for the future. Considering these factors, the overall suicide risk at this point appears to be low. Patient is appropriate for outpatient follow up.      Neysa Hottereina Lianna Sitzmann, MD 05/21/2021, 2:01 PM

## 2021-05-21 ENCOUNTER — Telehealth (INDEPENDENT_AMBULATORY_CARE_PROVIDER_SITE_OTHER): Payer: BC Managed Care – PPO | Admitting: Psychiatry

## 2021-05-21 ENCOUNTER — Other Ambulatory Visit: Payer: Self-pay

## 2021-05-21 ENCOUNTER — Encounter: Payer: Self-pay | Admitting: Psychiatry

## 2021-05-21 DIAGNOSIS — F3341 Major depressive disorder, recurrent, in partial remission: Secondary | ICD-10-CM

## 2021-05-21 MED ORDER — VENLAFAXINE HCL ER 37.5 MG PO CP24: 37.5000 mg | ORAL_CAPSULE | Freq: Every day | ORAL | 1 refills | Status: DC

## 2021-05-21 MED ORDER — BUPROPION HCL ER (XL) 150 MG PO TB24: 150.0000 mg | ORAL_TABLET | Freq: Every day | ORAL | 0 refills | Status: DC

## 2021-08-25 NOTE — Progress Notes (Signed)
Virtual Visit via Video Note ? ?I connected with Marcia Terry on 08/27/21 at  1:00 PM EDT by a video enabled telemedicine application and verified that I am speaking with the correct person using two identifiers. ? ?Location: ?Patient: home ?Provider: office ?Persons participated in the visit- patient, provider  ?  ?I discussed the limitations of evaluation and management by telemedicine and the availability of in person appointments. The patient expressed understanding and agreed to proceed. ? ? ?  ?I discussed the assessment and treatment plan with the patient. The patient was provided an opportunity to ask questions and all were answered. The patient agreed with the plan and demonstrated an understanding of the instructions. ?  ?The patient was advised to call back or seek an in-person evaluation if the symptoms worsen or if the condition fails to improve as anticipated. ? ?I provided 13 minutes of non-face-to-face time during this encounter. ? ? ?Norman Clay, MD ? ? ? ?BH MD/PA/NP OP Progress Note ? ?08/27/2021 1:26 PM ?Marcia Terry  ?MRN:  [id number] ? ?Chief Complaint:  ?Chief Complaint  ?Patient presents with  ? Follow-up  ? Depression  ? ?HPI:  ?This is a follow-up appointment for depression.  ?She states that she has been doing fine.  She is back from [place] with her grandchildren, including the one who just had one year old birthday.  She states that her father had a COVID.  He had a rib fracture. He is getting physical therapy at home. Although she feels worried and down about this, she is doing well otherwise.  She sleeps well.  She denies change in appetite.  She denies SI.  She denies panic attacks.  She takes medication regularly.  She feels comfortable to stay on the current medication regimen.  ? ?Employment: unemployed ?Household: ex-husband ?Marital status: separated in 2020 after 25 years of marriage ?Number of children:2 (4 grandchildren, oldest is 52 in 2022) ? ?Visit Diagnosis:  ?   ICD-10-CM   ?1. MDD (major depressive disorder), recurrent, in partial remission ([place])  F33.41 TSH  ?  ? ? ?Past Psychiatric History: Please see initial evaluation for full details. I have reviewed the history. No updates at this time.  ?  ? ?Past Medical History:  ?Past Medical History:  ?Diagnosis Date  ? ASCUS (atypical squamous cells of undetermined significance) on Pap smear   ? CIN I (cervical intraepithelial neoplasia I) 11/11  ? colpo done  ? PMS (premenstrual syndrome)   ?  ?Past Surgical History:  ?Procedure Laterality Date  ? COLPOSCOPY  11/11  ? TUBAL LIGATION    ? ? ?Family Psychiatric History: Please see initial evaluation for full details. I have reviewed the history. No updates at this time.  ?  ? ?Family History:  ?Family History  ?Problem Relation Age of Onset  ? Hypertension Mother   ? Huntington's disease Father   ? Cancer Maternal Grandmother 32  ?     colon  ? Huntington's disease Paternal Aunt   ? Huntington's disease Paternal Grandmother   ? ? ?Social History:  ?Social History  ? ?Socioeconomic History  ? Marital status: Married  ?  Spouse name: Not on file  ? Number of children: Not on file  ? Years of education: Not on file  ? Highest education level: Not on file  ?Occupational History  ? Not on file  ?Tobacco Use  ? Smoking status: Never  ? Smokeless tobacco: Never  ?Substance and Sexual Activity  ? Alcohol  use: No  ? Drug use: No  ? Sexual activity: Yes  ?  Birth control/protection: Pill  ?  Comment: Ocella  ?Other Topics Concern  ? Not on file  ?Social History Narrative  ? Not on file  ? ?Social Determinants of Health  ? ?Financial Resource Strain: Not on file  ?Food Insecurity: Not on file  ?Transportation Needs: Not on file  ?Physical Activity: Not on file  ?Stress: Not on file  ?Social Connections: Not on file  ? ? ?Allergies:  ?Allergies  ?Allergen Reactions  ? Penicillins   ? ? ?Metabolic Disorder Labs: ?No results found for: HGBA1C, MPG ?No results found for: PROLACTIN ?No results  found for: CHOL, TRIG, HDL, CHOLHDL, VLDL, LDLCALC ?No results found for: TSH ? ?Therapeutic Level Labs: ?No results found for: LITHIUM ?No results found for: VALPROATE ?No components found for:  CBMZ ? ?Current Medications: ?Current Outpatient Medications  ?Medication Sig Dispense Refill  ? [START ON 10/21/2021] buPROPion (WELLBUTRIN XL) 150 MG 24 hr tablet Take 1 tablet (150 mg total) by mouth daily. 90 tablet 1  ? Cholecalciferol (VITAMIN D PO) Take by mouth.    ? drospirenone-ethinyl estradiol (YASMIN,ZARAH,SYEDA) 3-0.03 MG tablet Take 1 tablet by mouth daily. 1 Package 11  ? fish oil-omega-3 fatty acids 1000 MG capsule Take 2 g by mouth daily.    ? folic acid (FOLVITE) 0.5 MG tablet Take 1 tablet (1 mg total) by mouth daily. 30 tablet 11  ? norethindrone-ethinyl estradiol (JUNEL FE,GILDESS FE,LOESTRIN FE) 1-20 MG-MCG tablet Take 1 tablet by mouth daily.    ? venlafaxine XR (EFFEXOR-XR) 37.5 MG 24 hr capsule Take 1 capsule (37.5 mg total) by mouth daily with breakfast. 90 capsule 1  ? ?No current facility-administered medications for this visit.  ? ? ? ?Musculoskeletal: ?Strength & Muscle Tone:  N/A ?Gait & Station:  N/A ?Patient leans: N/A ? ?Psychiatric Specialty Exam: ?Review of Systems  ?Psychiatric/Behavioral:  Negative for agitation, behavioral problems, confusion, decreased concentration, dysphoric mood, hallucinations, self-injury, sleep disturbance and suicidal ideas. The patient is nervous/anxious. The patient is not hyperactive.   ?All other systems reviewed and are negative.  ?There were no vitals taken for this visit.There is no height or weight on file to calculate BMI.  ?General Appearance: Fairly Groomed  ?Eye Contact:  Good  ?Speech:  Clear and Coherent  ?Volume:  Normal  ?Mood:   fine  ?Affect:  Appropriate, Congruent, and calm  ?Thought Process:  Coherent  ?Orientation:  Full (Time, Place, and Person)  ?Thought Content: Logical   ?Suicidal Thoughts:  No  ?Homicidal Thoughts:  No  ?Memory:   Immediate;   Good  ?Judgement:  Good  ?Insight:  Good  ?Psychomotor Activity:  Normal  ?Concentration:  Concentration: Good and Attention Span: Good  ?Recall:  Good  ?Fund of Knowledge: Good  ?Language: Good  ?Akathisia:  No  ?Handed:  Right  ?AIMS (if indicated): not done  ?Assets:  Communication Skills ?Desire for Improvement  ?ADL's:  Intact  ?Cognition: WNL  ?Sleep:  Good  ? ?Screenings: ?PHQ2-9   ? ?Flowsheet Row Video Visit from 10/23/2020 in [facility] Video Visit from 07/29/2020 in [facility] Video Visit from 06/10/2020 in [facility]  ?PHQ-2 Total Score 1 2 2   ?PHQ-9 Total Score -- 4 10  ? ?  ? ?Flowsheet Row Video Visit from 10/23/2020 in [facility] Video Visit from 07/29/2020 in [facility] Video Visit from 06/10/2020  in [facility]  ?C-SSRS RISK CATEGORY No Risk No Risk No Risk  ? ?  ? ? ? ?Assessment and Plan:  ?Marcia Terry is a 51 y.o. year old female with a history of depression, who presents for follow up appointment for below.  ?  ?1. MDD (major depressive disorder), recurrent, in partial remission ([place]) ?She denies any significant mood symptoms except self-limited anxiety in the context of her father having COVID (he suffers from Huntington's disease).  Will continue current dose of venlafaxine and bupropion as maintenance treatment for depression and anxiety.  Will obtain lab to rule out any medical health issues contributing to her mood symptoms/fatigue.  ? ?This clinician has discussed the side effect associated with medication prescribed during this encounter. Please refer to notes in the previous encounters for more details.   ?  ?Plan  ? Continue  venlafaxine 37.5 mg daily (drowsiness at 112.5 mg) ?Continue bupropion 150 mg daily ?Next appointment: 9/5 at 3 PM for 30 mins, in person  ?Obtain TSH  ?- oriahnn 400 mg twice a  day  ? ?Past trials of medication: sertraline, fluoxetine ("could not function")  ?  ?  ?The patient demonstrates the following risk factors for suicide: Chronic risk factors for suicide include: psychiatric

## 2021-08-27 ENCOUNTER — Telehealth (INDEPENDENT_AMBULATORY_CARE_PROVIDER_SITE_OTHER): Payer: BC Managed Care – PPO | Admitting: Psychiatry

## 2021-08-27 ENCOUNTER — Encounter: Payer: Self-pay | Admitting: Psychiatry

## 2021-08-27 DIAGNOSIS — F3341 Major depressive disorder, recurrent, in partial remission: Secondary | ICD-10-CM | POA: Diagnosis not present

## 2021-08-27 MED ORDER — BUPROPION HCL ER (XL) 150 MG PO TB24
150.0000 mg | ORAL_TABLET | Freq: Every day | ORAL | 1 refills | Status: DC
Start: 2021-10-21 — End: 2022-04-18

## 2021-08-27 NOTE — Patient Instructions (Signed)
Continue  venlafaxine 37.5 mg daily  ?Continue bupropion 150 mg daily ?Next appointment: 9/5 at 3 PM, in person  ? ?The next visit will be in person visit. Please arrive 15 mins before the scheduled time.  ? ?[place]  ?Address: [address], [place], [place] [address]   ?

## 2021-12-28 NOTE — Progress Notes (Deleted)
BH MD/PA/NP OP Progress Note  12/28/2021 5:59 AM Marcia Terry  MRN:  [id number]  Chief Complaint: No chief complaint on file.  HPI: *** Visit Diagnosis: No diagnosis found.  Past Psychiatric History: Please see initial evaluation for full details. I have reviewed the history. No updates at this time.     Past Medical History:  Past Medical History:  Diagnosis Date   ASCUS (atypical squamous cells of undetermined significance) on Pap smear    CIN I (cervical intraepithelial neoplasia I) 11/11   colpo done   PMS (premenstrual syndrome)     Past Surgical History:  Procedure Laterality Date   COLPOSCOPY  11/11   TUBAL LIGATION      Family Psychiatric History: Please see initial evaluation for full details. I have reviewed the history. No updates at this time.     Family History:  Family History  Problem Relation Age of Onset   Hypertension Mother    Huntington's disease Father    Cancer Maternal Grandmother 86       colon   Huntington's disease Paternal Aunt    Huntington's disease Paternal Grandmother     Social History:  Social History   Socioeconomic History   Marital status: Married    Spouse name: Not on file   Number of children: Not on file   Years of education: Not on file   Highest education level: Not on file  Occupational History   Not on file  Tobacco Use   Smoking status: Never   Smokeless tobacco: Never  Substance and Sexual Activity   Alcohol use: No   Drug use: No   Sexual activity: Yes    Birth control/protection: Pill    Comment: Ocella  Other Topics Concern   Not on file  Social History Narrative   Not on file   Social Determinants of Health   Financial Resource Strain: Not on file  Food Insecurity: Not on file  Transportation Needs: Not on file  Physical Activity: Not on file  Stress: Not on file  Social Connections: Not on file    Allergies:  Allergies  Allergen Reactions   Penicillins     Metabolic Disorder Labs: No  results found for: "HGBA1C", "MPG" No results found for: "PROLACTIN" No results found for: "CHOL", "TRIG", "HDL", "CHOLHDL", "VLDL", "LDLCALC" No results found for: "TSH"  Therapeutic Level Labs: No results found for: "LITHIUM" No results found for: "VALPROATE" No results found for: "CBMZ"  Current Medications: Current Outpatient Medications  Medication Sig Dispense Refill   buPROPion (WELLBUTRIN XL) 150 MG 24 hr tablet Take 1 tablet (150 mg total) by mouth daily. 90 tablet 1   Cholecalciferol (VITAMIN D PO) Take by mouth.     drospirenone-ethinyl estradiol (YASMIN,ZARAH,SYEDA) 3-0.03 MG tablet Take 1 tablet by mouth daily. 1 Package 11   fish oil-omega-3 fatty acids 1000 MG capsule Take 2 g by mouth daily.     folic acid (FOLVITE) 0.5 MG tablet Take 1 tablet (1 mg total) by mouth daily. 30 tablet 11   norethindrone-ethinyl estradiol (JUNEL FE,GILDESS FE,LOESTRIN FE) 1-20 MG-MCG tablet Take 1 tablet by mouth daily.     venlafaxine XR (EFFEXOR-XR) 37.5 MG 24 hr capsule Take 1 capsule (37.5 mg total) by mouth daily with breakfast. 90 capsule 1   No current facility-administered medications for this visit.     Musculoskeletal: Strength & Muscle Tone: within normal limits Gait & Station: normal Patient leans: N/A  Psychiatric Specialty Exam: Review of Systems  There  were no vitals taken for this visit.There is no height or weight on file to calculate BMI.  General Appearance: {Appearance:22683}  Eye Contact:  {BHH EYE CONTACT:22684}  Speech:  Clear and Coherent  Volume:  Normal  Mood:  {BHH MOOD:22306}  Affect:  {Affect (PAA):22687}  Thought Process:  Coherent  Orientation:  Full (Time, Place, and Person)  Thought Content: Logical   Suicidal Thoughts:  {ST/HT (PAA):22692}  Homicidal Thoughts:  {ST/HT (PAA):22692}  Memory:  Immediate;   Good  Judgement:  {Judgement (PAA):22694}  Insight:  {Insight (PAA):22695}  Psychomotor Activity:  Normal  Concentration:  Concentration:  Good and Attention Span: Good  Recall:  Good  Fund of Knowledge: Good  Language: Good  Akathisia:  No  Handed:  Right  AIMS (if indicated): not done  Assets:  Communication Skills Desire for Improvement  ADL's:  Intact  Cognition: WNL  Sleep:  {BHH GOOD/FAIR/POOR:22877}   Screenings: PHQ2-9    Flowsheet Row Video [facility] from 10[facility]2020 in [facility] Video Visit from 07/29/2020 in [facility] Video Visit from 06/10/2020 in [facility]  PHQ-2 Total Score 1 2 2   PHQ-9 Total Score -- 4 10      Flowsheet Row Video Visit from 10/23/2020 in [facility] Video Visit from 07/29/2020 in [facility] Video Visit from 06/10/2020 in [facility]  C-SSRS RISK CATEGORY No Risk No Risk No Risk        Assessment and Plan:  Marcia Terry is a 51 y.o. year old female with a history of  depression, who presents for follow up appointment for below.     1. MDD (major depressive disorder), recurrent, in partial remission (HCC) She denies any significant mood symptoms except self-limited anxiety in the context of her father having COVID (he suffers from Huntington's disease).  Will continue current dose of venlafaxine and bupropion as maintenance treatment for depression and anxiety.  Will obtain lab to rule out any medical health issues contributing to her mood symptoms/fatigue.    This clinician has discussed the side effect associated with medication prescribed during this encounter. Please refer to notes in the previous encounters for more details.     Plan   Continue  venlafaxine 37.5 mg daily (drowsiness at 112.5 mg) Continue bupropion 150 mg daily Next appointment: 9/5 at 3 PM for 30 mins, in person  Obtain TSH  - oriahnn 400 mg twice a day    Past trials of medication: sertraline, fluoxetine ("could not function")      The  patient demonstrates the following risk factors for suicide: Chronic risk factors for suicide include: psychiatric disorder of depression. Acute risk factors for suicide include: N/A. Protective factors for this patient include: positive social support, responsibility to others (children, family), coping skills and hope for the future. Considering these factors, the overall suicide risk at this point appears to be low. Patient is appropriate for outpatient follow up.          Collaboration of Care: Collaboration of Care: {BH OP Collaboration of Care:21014065}  Patient/Guardian was advised Release of Information must be obtained prior to any record release in order to collaborate their care with an outside provider. Patient/Guardian was advised if they have not already done so to contact the registration department to sign all necessary forms in order for 11/5 to release information regarding their care.   Consent: Patient/Guardian gives verbal consent for treatment and assignment of benefits for services  provided during this visit. Patient/Guardian expressed understanding and agreed to proceed.    [name], MD 12/28/2021, 5:59 AM

## 2021-12-29 ENCOUNTER — Ambulatory Visit: Payer: BC Managed Care – PPO | Admitting: Psychiatry

## 2021-12-30 NOTE — Progress Notes (Signed)
BH MD/PA/NP OP Progress Note  12/31/2021 1:43 PM Marcia Terry  MRN:  [id number]  Chief Complaint:  Chief Complaint  Patient presents with   Follow-up   Medication Refill   HPI:  This is a follow-up appointment for depression.  She states that she has been doing well.  She was very busy until lately.  She visited her father in [place].  It was hard to see him having fall.  He lives at home with his wife.  She is planning to get genetic testing for Huntington's disease now that she has more knowledge about the disease, and the treatment has been progressing.  She helped her daughter, who has moved to [place].  She likes staying busy than sitting at home.  Although she has occasional diaphoresis and emotional lability at times, she is thinks it has been manageable.  She would like to stay on the current medication as it is. The patient has mood symptoms as in PHQ-9/GAD-7. She denies SI. She rarely drinks alcohol. She denies drug use.   Employment: unemployed Household: ex-husband Marital status: separated in 2020 after 25 years of marriage (getting back together) Number of children:2 (4 grandchildren, oldest is 4 in 2022)   Wt Readings from Last 3 Encounters:  12/31/21 150 lb 9.6 oz (68.3 kg)  05/16/18 144 lb (65.3 kg)  02/10/18 146 lb (66.2 kg)    Visit Diagnosis:    ICD-10-CM   1. MDD (major depressive disorder), recurrent, in partial remission (HCC)  F33.41       Past Psychiatric History: Please see initial evaluation for full details. I have reviewed the history. No updates at this time.     Past Medical History:  Past Medical History:  Diagnosis Date   ASCUS (atypical squamous cells of undetermined significance) on Pap smear    CIN I (cervical intraepithelial neoplasia I) 11/11   colpo done   PMS (premenstrual syndrome)     Past Surgical History:  Procedure Laterality Date   COLPOSCOPY  11/11   TUBAL LIGATION      Family Psychiatric History: Please see initial  evaluation for full details. I have reviewed the history. No updates at this time.     Family History:  Family History  Problem Relation Age of Onset   Hypertension Mother    Huntington's disease Father    Cancer Maternal Grandmother 32       colon   Huntington's disease Paternal Aunt    Huntington's disease Paternal Grandmother     Social History:  Social History   Socioeconomic History   Marital status: Married    Spouse name: Not on file   Number of children: Not on file   Years of education: Not on file   Highest education level: Not on file  Occupational History   Not on file  Tobacco Use   Smoking status: Never   Smokeless tobacco: Never  Substance and Sexual Activity   Alcohol use: No   Drug use: No   Sexual activity: Yes    Birth control/protection: Pill    Comment: Ocella  Other Topics Concern   Not on file  Social History Narrative   Not on file   Social Determinants of Health   Financial Resource Strain: Not on file  Food Insecurity: Not on file  Transportation Needs: Not on file  Physical Activity: Not on file  Stress: Not on file  Social Connections: Not on file    Allergies:  Allergies  Allergen Reactions  Penicillins     Metabolic Disorder Labs: No results found for: "HGBA1C", "MPG" No results found for: "PROLACTIN" No results found for: "CHOL", "TRIG", "HDL", "CHOLHDL", "VLDL", "LDLCALC" No results found for: "TSH"  Therapeutic Level Labs: No results found for: "LITHIUM" No results found for: "VALPROATE" No results found for: "CBMZ"  Current Medications: Current Outpatient Medications  Medication Sig Dispense Refill   buPROPion (WELLBUTRIN XL) 150 MG 24 hr tablet Take 1 tablet (150 mg total) by mouth daily. 90 tablet 1   Cholecalciferol (VITAMIN D PO) Take by mouth.     fish oil-omega-3 fatty acids 1000 MG capsule Take 2 g by mouth daily.     venlafaxine XR (EFFEXOR-XR) 37.5 MG 24 hr capsule Take 1 capsule (37.5 mg total) by  mouth daily with breakfast. 90 capsule 1   No current facility-administered medications for this visit.     Musculoskeletal: Strength & Muscle Tone: within normal limits Gait & Station: normal Patient leans: N/A  Psychiatric Specialty Exam: Review of Systems  Psychiatric/Behavioral:  Positive for dysphoric mood and sleep disturbance. Negative for agitation, behavioral problems, confusion, decreased concentration, hallucinations, self-injury and suicidal ideas. The patient is nervous/anxious. The patient is not hyperactive.   All other systems reviewed and are negative.   Blood pressure 122/80, pulse 83, temperature 98.7 F (37.1 C), temperature source Temporal, height 5' 2.5" (1.588 m), weight 150 lb 9.6 oz (68.3 kg).Body mass index is 27.11 kg/m.  General Appearance: Fairly Groomed  Eye Contact:  Good  Speech:  Clear and Coherent  Volume:  Normal  Mood:   good  Affect:  Appropriate, Congruent, and calm  Thought Process:  Coherent  Orientation:  Full (Time, Place, and Person)  Thought Content: Logical   Suicidal Thoughts:  No  Homicidal Thoughts:  No  Memory:  Immediate;   Good  Judgement:  Good  Insight:  Good  Psychomotor Activity:  Normal  Concentration:  Concentration: Good and Attention Span: Good  Recall:  Good  Fund of Knowledge: Good  Language: Good  Akathisia:  No  Handed:  Right  AIMS (if indicated): not done  Assets:  Communication Skills Desire for Improvement  ADL's:  Intact  Cognition: WNL  Sleep:  Fair   Screenings: GAD-7    Flowsheet Row Office Visit from 12/31/2021 in [facility]  Total GAD-7 Score 4      PHQ2-9    Flowsheet Row Office Visit from 12/31/2021 in [facility] Video Visit from 10/23/2020 in [facility] Video Visit from 07/29/2020 in [facility] Video Visit from 06/10/2020 in [facility]  PHQ-2  Total Score 0 1 2 2   PHQ-9 Total Score 4 -- 4 10      Flowsheet Row Video Visit from 10/23/2020 in [facility] Video Visit from 07/29/2020 in [facility] Video Visit from 06/10/2020 in [facility]  C-SSRS RISK CATEGORY No Risk No Risk No Risk        Assessment and Plan:  Marcia Terry is a 51 y.o. year old female with a history of depression, who presents for follow up appointment for below.   1. MDD (major depressive disorder), recurrent, in partial remission (HCC) Although she reports occasional anxiety and depressed mood, it has been self-limiting since the last visit.  Psychosocial stressors includes her father, who is suffering from Huntington's disease.  Will continue current dose of venlafaxine and bupropion as maintenance treatment for depression and  anxiety.      Plan  Continue  venlafaxine 37.5 mg daily (drowsiness at 112.5 mg) Continue bupropion 150 mg daily Next appointment: 12/4 at 11 AM for 30 mins, in person.  She reports interest in transferring to [place] for easier commute.  She agreed to keep the appointment with this writer and that she is notified otherwise.  - She reportedly had  thyroid test - oriahnn 400 mg twice a day    Past trials of medication: sertraline, fluoxetine ("could not function")      The patient demonstrates the following risk factors for suicide: Chronic risk factors for suicide include: psychiatric disorder of depression. Acute risk factors for suicide include: N/A. Protective factors for this patient include: positive social support, responsibility to others (children, family), coping skills and hope for the future. Considering these factors, the overall suicide risk at this point appears to be low. Patient is appropriate for outpatient follow up.   This clinician has discussed the side effect associated with medication prescribed during this encounter.  Please refer to notes in the previous encounters for more details.     Collaboration of Care: Collaboration of Care: Other N/A  Patient/Guardian was advised Release of Information must be obtained prior to any record release in order to collaborate their care with an outside provider. Patient/Guardian was advised if they have not already done so to contact the registration department to sign all necessary forms in order for [place] to release information regarding their care.   Consent: Patient/Guardian gives verbal consent for treatment and assignment of benefits for services provided during this visit. Patient/Guardian expressed understanding and agreed to proceed.    [name], MD 12/31/2021, 1:43 PM

## 2021-12-31 ENCOUNTER — Encounter: Payer: Self-pay | Admitting: Psychiatry

## 2021-12-31 ENCOUNTER — Ambulatory Visit (INDEPENDENT_AMBULATORY_CARE_PROVIDER_SITE_OTHER): Payer: BC Managed Care – PPO | Admitting: Psychiatry

## 2021-12-31 VITALS — BP 122/80 | HR 83 | Temp 98.7°F | Ht 62.5 in | Wt 150.6 lb

## 2021-12-31 DIAGNOSIS — F3341 Major depressive disorder, recurrent, in partial remission: Secondary | ICD-10-CM

## 2021-12-31 MED ORDER — VENLAFAXINE HCL ER 37.5 MG PO CP24
37.5000 mg | ORAL_CAPSULE | Freq: Every day | ORAL | 1 refills | Status: DC
Start: 2021-12-31 — End: 2022-05-24

## 2022-03-25 NOTE — Progress Notes (Deleted)
BH MD/PA/NP OP Progress Note  03/25/2022 4:09 PM Marcia Terry  MRN:  [id number]  Chief Complaint: No chief complaint on file.  HPI: ***  Employment: unemployed Household: ex-husband Marital status: separated in 2020 after 25 years of marriage (getting back together) Number of children:2 (4 grandchildren, oldest is 4 in 2022)  Visit Diagnosis: No diagnosis found.  Past Psychiatric History: Please see initial evaluation for full details. I have reviewed the history. No updates at this time.     Past Medical History:  Past Medical History:  Diagnosis Date   ASCUS (atypical squamous cells of undetermined significance) on Pap smear    CIN I (cervical intraepithelial neoplasia I) 11/11   colpo done   PMS (premenstrual syndrome)     Past Surgical History:  Procedure Laterality Date   COLPOSCOPY  11/11   TUBAL LIGATION      Family Psychiatric History: Please see initial evaluation for full details. I have reviewed the history. No updates at this time.     Family History:  Family History  Problem Relation Age of Onset   Hypertension Mother    Huntington's disease Father    Cancer Maternal Grandmother 27       colon   Huntington's disease Paternal Aunt    Huntington's disease Paternal Grandmother     Social History:  Social History   Socioeconomic History   Marital status: Married    Spouse name: Not on file   Number of children: Not on file   Years of education: Not on file   Highest education level: Not on file  Occupational History   Not on file  Tobacco Use   Smoking status: Never   Smokeless tobacco: Never  Substance and Sexual Activity   Alcohol use: No   Drug use: No   Sexual activity: Yes    Birth control/protection: Pill    Comment: Ocella  Other Topics Concern   Not on file  Social History Narrative   Not on file   Social Determinants of Health   Financial Resource Strain: Not on file  Food Insecurity: Not on file  Transportation Needs:  Not on file  Physical Activity: Not on file  Stress: Not on file  Social Connections: Not on file    Allergies:  Allergies  Allergen Reactions   Penicillins     Metabolic Disorder Labs: No results found for: "HGBA1C", "MPG" No results found for: "PROLACTIN" No results found for: "CHOL", "TRIG", "HDL", "CHOLHDL", "VLDL", "LDLCALC" No results found for: "TSH"  Therapeutic Level Labs: No results found for: "LITHIUM" No results found for: "VALPROATE" No results found for: "CBMZ"  Current Medications: Current Outpatient Medications  Medication Sig Dispense Refill   buPROPion (WELLBUTRIN XL) 150 MG 24 hr tablet Take 1 tablet (150 mg total) by mouth daily. 90 tablet 1   Cholecalciferol (VITAMIN D PO) Take by mouth.     fish oil-omega-3 fatty acids 1000 MG capsule Take 2 g by mouth daily.     venlafaxine XR (EFFEXOR-XR) 37.5 MG 24 hr capsule Take 1 capsule (37.5 mg total) by mouth daily with breakfast. 90 capsule 1   No current facility-administered medications for this visit.     Musculoskeletal: Strength & Muscle Tone: within normal limits Gait & Station: normal Patient leans: N/A  Psychiatric Specialty Exam: Review of Systems  There were no vitals taken for this visit.There is no height or weight on file to calculate BMI.  General Appearance: {Appearance:22683}  Eye Contact:  {BHH EYE  CONTACT:22684}  Speech:  Clear and Coherent  Volume:  Normal  Mood:  {BHH MOOD:22306}  Affect:  {Affect (PAA):22687}  Thought Process:  Coherent  Orientation:  Full (Time, Place, and Person)  Thought Content: Logical   Suicidal Thoughts:  {ST/HT (PAA):22692}  Homicidal Thoughts:  {ST/HT (PAA):22692}  Memory:  Immediate;   Good  Judgement:  {Judgement (PAA):22694}  Insight:  {Insight (PAA):22695}  Psychomotor Activity:  Normal  Concentration:  {Concentration:21399}  Recall:  {BHH GOOD/FAIR/POOR:22877}  Fund of Knowledge: Good  Language: Good  Akathisia:  No  Handed:  Right  AIMS  (if indicated): not done  Assets:  Communication Skills Desire for Improvement  ADL's:  Intact  Cognition: WNL  Sleep:  {BHH GOOD/FAIR/POOR:22877}   Screenings: GAD-7    Flowsheet Row Office Visit from 12/31/2021 in [facility]  Total GAD-7 Score 4      PHQ2-9    Flowsheet Row Office Visit from 12/31/2021 in [facility] Video Visit from 10/23/2020 in [facility] Video Visit from 07/29/2020 in [facility] Video Visit from 06/10/2020 in [facility]  PHQ-2 Total Score 0 1 2 2   PHQ-9 Total Score 4 -- 4 10      Flowsheet Row Video Visit from 10/23/2020 in [facility] Video Visit from 07/29/2020 in [facility] Video Visit from 06/10/2020 in [facility]  C-SSRS RISK CATEGORY No Risk No Risk No Risk        Assessment and Plan:  Marcia Terry is a 51 y.o. year old female with a history of depression, who presents for follow up appointment for below.    1. MDD (major depressive disorder), recurrent, in partial remission (HCC) Although she reports occasional anxiety and depressed mood, it has been self-limiting since the last visit.  Psychosocial stressors includes her father, who is suffering from Huntington's disease.  Will continue current dose of venlafaxine and bupropion as maintenance treatment for depression and anxiety.      Plan  Continue  venlafaxine 37.5 mg daily (drowsiness at 112.5 mg) Continue bupropion 150 mg daily Next appointment: 12/4 at 11 AM for 30 mins, in person.  She reports interest in transferring to [place] for easier commute.  She agreed to keep the appointment with this writer and that she is notified otherwise.  - She reportedly had  thyroid test - oriahnn 400 mg twice a day    Past trials of medication: sertraline, fluoxetine ("could  not function")      The patient demonstrates the following risk factors for suicide: Chronic risk factors for suicide include: psychiatric disorder of depression. Acute risk factors for suicide include: N/A. Protective factors for this patient include: positive social support, responsibility to others (children, family), coping skills and hope for the future. Considering these factors, the overall suicide risk at this point appears to be low. Patient is appropriate for outpatient follow up.     Collaboration of Care: Collaboration of Care: {BH OP Collaboration of Care:21014065}  Patient/Guardian was advised Release of Information must be obtained prior to any record release in order to collaborate their care with an outside provider. Patient/Guardian was advised if they have not already done so to contact the registration department to sign all necessary forms in order for [place] to release information regarding their care.   Consent: Patient/Guardian gives verbal consent for treatment and assignment of benefits for services provided during this visit. Patient/Guardian expressed understanding and agreed  to proceed.    [name], MD 03/25/2022, 4:09 PM

## 2022-03-29 ENCOUNTER — Ambulatory Visit: Payer: BC Managed Care – PPO | Admitting: Psychiatry

## 2022-04-17 ENCOUNTER — Other Ambulatory Visit: Payer: Self-pay | Admitting: Psychiatry
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
## 2022-05-24 ENCOUNTER — Ambulatory Visit (INDEPENDENT_AMBULATORY_CARE_PROVIDER_SITE_OTHER): Payer: BC Managed Care – PPO | Admitting: Psychiatry

## 2022-05-24 ENCOUNTER — Encounter: Payer: Self-pay | Admitting: Psychiatry

## 2022-05-24 VITALS — BP 111/75 | HR 76 | Temp 98.5°F | Ht 62.5 in | Wt 150.0 lb

## 2022-05-24 DIAGNOSIS — F3341 Major depressive disorder, recurrent, in partial remission: Secondary | ICD-10-CM | POA: Diagnosis not present

## 2022-05-24 DIAGNOSIS — F411 Generalized anxiety disorder: Secondary | ICD-10-CM

## 2022-05-24 MED ORDER — BUPROPION HCL ER (XL) 150 MG PO TB24: 150.0000 mg | ORAL_TABLET | Freq: Every day | ORAL | 0 refills | Status: DC

## 2022-05-24 MED ORDER — VENLAFAXINE HCL ER 37.5 MG PO CP24: 37.5000 mg | ORAL_CAPSULE | Freq: Every day | ORAL | 0 refills | Status: DC

## 2022-05-24 NOTE — Patient Instructions (Signed)
1  Continue  venlafaxine 37.5 mg daily (drowsiness at 112.5 mg) 2. Continue bupropion 150 mg daily 3. Next appointment: 4/8 at 1 PM

## 2022-05-24 NOTE — Addendum Note (Signed)
Addended by: Norman Clay on: 05/24/2022 01:51 PM   Modules accepted: Level of Service

## 2022-07-07 ENCOUNTER — Other Ambulatory Visit: Payer: Self-pay | Admitting: Psychiatry

## 2022-07-07 ENCOUNTER — Telehealth: Payer: Self-pay | Admitting: Psychiatry

## 2022-07-07 NOTE — Telephone Encounter (Signed)
Called to speak to her about the medication no answer unable to leave a voicemail due to the mailbox being full

## 2022-07-07 NOTE — Telephone Encounter (Signed)
Received a refill request for venlafaxine, Dr. Ivor Reining patient.  Per review of medical records a refill was sent for venlafaxine to her pharmacy at [place] [facility] by DR.Hisada. If she needs it [facility] another [facility] she will have to [facility] other [facility], [facility].  Please let patient know.

## 2022-07-07 NOTE — Telephone Encounter (Signed)
Noted  

## 2022-07-08 ENCOUNTER — Telehealth: Payer: Self-pay | Admitting: Psychiatry

## 2022-07-08 NOTE — Telephone Encounter (Signed)
Received another request for Effexor extended release.  Patient to call her [facility] in [place] to [facility] to new [facility].

## 2022-07-09 NOTE — Telephone Encounter (Signed)
Called patient no answer left voicemail for patient to return call to office. 

## 2022-07-09 NOTE — Telephone Encounter (Signed)
Noted  

## 2022-07-13 NOTE — Telephone Encounter (Signed)
  The request was declined since the medication has already been ordered for the patient's preferred pharmacy. Please attempt to contact her again inform her of this. If necessary, she may transfer the order to that pharmacy.  [id number], [place] [address] [place], [place] [address] Phone: [phone]  Fax: [phone]
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
## 2022-08-02 ENCOUNTER — Ambulatory Visit (INDEPENDENT_AMBULATORY_CARE_PROVIDER_SITE_OTHER): Payer: BC Managed Care – PPO | Admitting: Psychiatry

## 2022-08-02 ENCOUNTER — Encounter: Payer: Self-pay | Admitting: Psychiatry

## 2022-08-02 VITALS — BP 124/73 | HR 71 | Temp 98.0°F | Ht 62.5 in | Wt 152.6 lb

## 2022-08-02 DIAGNOSIS — F330 Major depressive disorder, recurrent, mild: Secondary | ICD-10-CM

## 2022-08-02 DIAGNOSIS — F411 Generalized anxiety disorder: Secondary | ICD-10-CM | POA: Diagnosis not present

## 2022-08-02 MED ORDER — VENLAFAXINE HCL ER 75 MG PO CP24: 75.0000 mg | ORAL_CAPSULE | Freq: Every day | ORAL | 0 refills | Status: DC

## 2022-08-02 NOTE — Patient Instructions (Signed)
Increase venlafaxine 75 mg daily Continue bupropion 150 mg daily Next appointment: 6/3 at 3 pm

## 2022-08-31 ENCOUNTER — Other Ambulatory Visit: Payer: Self-pay | Admitting: Obstetrics and Gynecology

## 2022-08-31 DIAGNOSIS — Z79899 Other long term (current) drug therapy: Secondary | ICD-10-CM
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
## 2022-09-27 ENCOUNTER — Ambulatory Visit: Payer: BC Managed Care – PPO | Admitting: Psychiatry

## 2022-10-09 ENCOUNTER — Other Ambulatory Visit: Payer: Self-pay | Admitting: Psychiatry

## 2022-10-27 ENCOUNTER — Other Ambulatory Visit: Payer: Self-pay | Admitting: Psychiatry
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
## 2022-11-01 ENCOUNTER — Ambulatory Visit (INDEPENDENT_AMBULATORY_CARE_PROVIDER_SITE_OTHER): Payer: BC Managed Care – PPO | Admitting: Psychiatry

## 2022-11-01 ENCOUNTER — Encounter: Payer: Self-pay | Admitting: Psychiatry

## 2022-11-01 VITALS — BP 103/69 | HR 89 | Temp 98.0°F | Ht 62.5 in | Wt 150.0 lb

## 2022-11-01 DIAGNOSIS — F330 Major depressive disorder, recurrent, mild: Secondary | ICD-10-CM | POA: Diagnosis not present

## 2022-11-01 DIAGNOSIS — F411 Generalized anxiety disorder: Secondary | ICD-10-CM

## 2022-11-03 ENCOUNTER — Other Ambulatory Visit: Payer: Self-pay | Admitting: Psychiatry

## 2022-11-03 ENCOUNTER — Telehealth: Payer: Self-pay

## 2022-11-03 MED ORDER — VENLAFAXINE HCL ER 75 MG PO CP24: 75.0000 mg | ORAL_CAPSULE | Freq: Every day | ORAL | 0 refills | Status: DC

## 2022-11-03 NOTE — Telephone Encounter (Signed)
pt called left message that she was seen this week and that she needed refills on the effexor. pt was seen on 7-8 next appt 9-9

## 2022-11-03 NOTE — Telephone Encounter (Signed)
Ordered

## 2022-11-04 NOTE — Telephone Encounter (Signed)
Pt.notified

## 2022-11-23 ENCOUNTER — Ambulatory Visit: Payer: BC Managed Care – PPO | Admitting: Psychiatry
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
## 2023-01-03 ENCOUNTER — Ambulatory Visit: Payer: BC Managed Care – PPO | Admitting: Psychiatry

## 2023-01-29 ENCOUNTER — Other Ambulatory Visit: Payer: Self-pay | Admitting: Psychiatry
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
## 2023-02-28 ENCOUNTER — Ambulatory Visit (INDEPENDENT_AMBULATORY_CARE_PROVIDER_SITE_OTHER): Payer: BC Managed Care – PPO | Admitting: Psychiatry

## 2023-02-28 ENCOUNTER — Encounter: Payer: Self-pay | Admitting: Psychiatry

## 2023-02-28 VITALS — BP 123/76 | HR 76 | Temp 97.8°F | Ht 62.5 in | Wt 151.4 lb

## 2023-02-28 DIAGNOSIS — F411 Generalized anxiety disorder: Secondary | ICD-10-CM | POA: Diagnosis not present

## 2023-02-28 DIAGNOSIS — F330 Major depressive disorder, recurrent, mild: Secondary | ICD-10-CM

## 2023-02-28 MED ORDER — VENLAFAXINE HCL ER 75 MG PO CP24: 75.0000 mg | ORAL_CAPSULE | Freq: Every day | ORAL | 0 refills | Status: AC

## 2023-04-14 ENCOUNTER — Telehealth: Payer: Self-pay

## 2023-04-14 ENCOUNTER — Other Ambulatory Visit: Payer: Self-pay | Admitting: Psychiatry

## 2023-04-14 MED ORDER — BUPROPION HCL ER (XL) 300 MG PO TB24: 300.0000 mg | ORAL_TABLET | Freq: Every day | ORAL | 0 refills | Status: AC

## 2023-04-14 NOTE — Telephone Encounter (Signed)
I spoke with the patient.  She shared that her father may not survive the next year and that she has also been diagnosed with a serious illness. Currently, she is with her children for the holidays but plans to return to [place]. She has an appointment with her psychiatrist on 12/24 but expressed a desire to adjust her medication due to how she has been feeling. She denies any suicidal ideation. She denies history of seizure.  - ordered bupropion 300 mg daily  - dicussed emergency resources if any worsening

## 2023-04-14 NOTE — Telephone Encounter (Signed)
Patient called requesting an increase of the following medication she stated that her father is in hospice and her marriage is falling apart and she needs something to help her right now please advise  buPROPion (WELLBUTRIN XL) 150 MG 24 hr tablet

## 2023-05-08 ENCOUNTER — Other Ambulatory Visit: Payer: Self-pay | Admitting: Psychiatry

## 2023-07-05 ENCOUNTER — Other Ambulatory Visit: Payer: Self-pay | Admitting: Obstetrics and Gynecology

## 2023-07-05 DIAGNOSIS — Z1231 Encounter for screening mammogram for malignant neoplasm of breast: Secondary | ICD-10-CM
